# Patient Record
Sex: Female | Born: 1940 | Race: Black or African American | Hispanic: No | Marital: Married | Smoking: Former smoker
Health system: Southern US, Community
[De-identification: ages and names within clinical notes are randomized; demographics above are authoritative.]

## PROBLEM LIST (undated history)

## (undated) DIAGNOSIS — G309 Alzheimer's disease, unspecified: Secondary | ICD-10-CM

## (undated) DIAGNOSIS — I10 Essential (primary) hypertension: Secondary | ICD-10-CM

## (undated) DIAGNOSIS — E119 Type 2 diabetes mellitus without complications: Secondary | ICD-10-CM

## (undated) DIAGNOSIS — G43909 Migraine, unspecified, not intractable, without status migrainosus: Secondary | ICD-10-CM

## (undated) DIAGNOSIS — M797 Fibromyalgia: Secondary | ICD-10-CM

## (undated) DIAGNOSIS — F0390 Unspecified dementia without behavioral disturbance: Secondary | ICD-10-CM

## (undated) DIAGNOSIS — C801 Malignant (primary) neoplasm, unspecified: Secondary | ICD-10-CM

## (undated) DIAGNOSIS — K219 Gastro-esophageal reflux disease without esophagitis: Secondary | ICD-10-CM

## (undated) DIAGNOSIS — N189 Chronic kidney disease, unspecified: Secondary | ICD-10-CM

## (undated) DIAGNOSIS — J189 Pneumonia, unspecified organism: Secondary | ICD-10-CM

## (undated) DIAGNOSIS — I639 Cerebral infarction, unspecified: Secondary | ICD-10-CM

## (undated) DIAGNOSIS — F0280 Dementia in other diseases classified elsewhere without behavioral disturbance: Secondary | ICD-10-CM

## (undated) HISTORY — PX: SPINAL FUSION: SHX223

## (undated) HISTORY — PX: OTHER SURGICAL HISTORY: SHX169

## (undated) HISTORY — PX: BREAST SURGERY: SHX581

## (undated) HISTORY — PX: APPENDECTOMY: SHX54

## (undated) HISTORY — PX: ABDOMINAL HYSTERECTOMY: SHX81

## (undated) HISTORY — PX: TUMOR REMOVAL: SHX12

## (undated) HISTORY — PX: REPLACEMENT TOTAL KNEE: SUR1224

## (undated) HISTORY — PX: CHOLECYSTECTOMY: SHX55

---

## 2014-09-07 ENCOUNTER — Encounter (HOSPITAL_COMMUNITY): Payer: Self-pay | Admitting: Emergency Medicine

## 2014-09-07 ENCOUNTER — Emergency Department (HOSPITAL_COMMUNITY): Payer: Medicare HMO

## 2014-09-07 ENCOUNTER — Emergency Department (HOSPITAL_COMMUNITY)
Admission: EM | Admit: 2014-09-07 | Discharge: 2014-09-07 | Disposition: A | Discharge status: Home / Self Care | Payer: Medicare HMO | Attending: Emergency Medicine | Admitting: Emergency Medicine

## 2014-09-07 DIAGNOSIS — W010XXA Fall on same level from slipping, tripping and stumbling without subsequent striking against object, initial encounter: Secondary | ICD-10-CM | POA: Diagnosis not present

## 2014-09-07 DIAGNOSIS — G309 Alzheimer's disease, unspecified: Secondary | ICD-10-CM | POA: Insufficient documentation

## 2014-09-07 DIAGNOSIS — Z79899 Other long term (current) drug therapy: Secondary | ICD-10-CM | POA: Insufficient documentation

## 2014-09-07 DIAGNOSIS — I10 Essential (primary) hypertension: Secondary | ICD-10-CM | POA: Diagnosis not present

## 2014-09-07 DIAGNOSIS — S0083XA Contusion of other part of head, initial encounter: Secondary | ICD-10-CM | POA: Insufficient documentation

## 2014-09-07 DIAGNOSIS — Y92009 Unspecified place in unspecified non-institutional (private) residence as the place of occurrence of the external cause: Secondary | ICD-10-CM | POA: Insufficient documentation

## 2014-09-07 DIAGNOSIS — Z87891 Personal history of nicotine dependence: Secondary | ICD-10-CM | POA: Diagnosis not present

## 2014-09-07 DIAGNOSIS — S42351A Displaced comminuted fracture of shaft of humerus, right arm, initial encounter for closed fracture: Secondary | ICD-10-CM | POA: Diagnosis not present

## 2014-09-07 DIAGNOSIS — Y998 Other external cause status: Secondary | ICD-10-CM | POA: Insufficient documentation

## 2014-09-07 DIAGNOSIS — S42301A Unspecified fracture of shaft of humerus, right arm, initial encounter for closed fracture: Secondary | ICD-10-CM

## 2014-09-07 DIAGNOSIS — Y9301 Activity, walking, marching and hiking: Secondary | ICD-10-CM | POA: Insufficient documentation

## 2014-09-07 DIAGNOSIS — G43909 Migraine, unspecified, not intractable, without status migrainosus: Secondary | ICD-10-CM | POA: Diagnosis not present

## 2014-09-07 DIAGNOSIS — W19XXXA Unspecified fall, initial encounter: Secondary | ICD-10-CM

## 2014-09-07 DIAGNOSIS — S4991XA Unspecified injury of right shoulder and upper arm, initial encounter: Secondary | ICD-10-CM | POA: Diagnosis present

## 2014-09-07 HISTORY — DX: Essential (primary) hypertension: I10

## 2014-09-07 HISTORY — DX: Alzheimer's disease, unspecified: G30.9

## 2014-09-07 HISTORY — DX: Dementia in other diseases classified elsewhere, unspecified severity, without behavioral disturbance, psychotic disturbance, mood disturbance, and anxiety: F02.80

## 2014-09-07 HISTORY — DX: Migraine, unspecified, not intractable, without status migrainosus: G43.909

## 2014-09-07 LAB — CBG MONITORING, ED: Glucose-Capillary: 103 mg/dL — ABNORMAL HIGH (ref 65–99)

## 2014-09-07 MED ORDER — HYDROCODONE-ACETAMINOPHEN 5-325 MG PO TABS
2.0000 | ORAL_TABLET | Freq: Once | ORAL | Status: DC
  Filled 2014-09-07: qty 2

## 2014-09-07 MED ORDER — HYDROCODONE-ACETAMINOPHEN 5-325 MG PO TABS: 1.0000 | ORAL_TABLET | Freq: Four times a day (QID) | ORAL | Status: DC | PRN

## 2014-09-07 MED ORDER — MORPHINE SULFATE 4 MG/ML IJ SOLN
4.0000 mg | Freq: Once | INTRAMUSCULAR | Status: AC
  Administered 2014-09-07: 4 mg via INTRAVENOUS
  Filled 2014-09-07: qty 1

## 2014-09-07 NOTE — ED Provider Notes (Signed)
CSN: [id number]     Arrival date & time 09/07/14  1730 History   First MD Initiated Contact with Patient 09/07/14 1758     Chief Complaint  Patient presents with  . Fall  . Arm Injury     (Consider location/radiation/quality/duration/timing/severity/associated sxs/prior Treatment) Patient is a 74 y.o. female presenting with arm injury.  Arm Injury Location:  Arm Time since incident: shortly prior to arrival. Injury: yes   Mechanism of injury: fall   Fall:    Fall occurred: tripped on the carpet.   Point of impact:  Head (r arm) Arm location:  R upper arm Pain details:    Quality:  Sharp   Severity:  Severe   Onset quality:  Sudden   Timing:  Constant   Progression:  Unchanged Relieved by:  Nothing Worsened by:  Movement Associated symptoms: decreased range of motion (due to pain)   Associated symptoms: no fever, no neck pain and no numbness     Past Medical History  Diagnosis Date  . Hypertension   . Migraine   . Alzheimer disease    Past Surgical History  Procedure Laterality Date  . Replacement total knee Left   . Breast surgery    . Cholecystectomy    . Abdominal hysterectomy    . Tumor removal Right     right breast tumor removal   History reviewed. No pertinent family history. History  Substance Use Topics  . Smoking status: Former Research scientist (life sciences)  . Smokeless tobacco: Never Used  . Alcohol Use: No   OB History    No data available     Review of Systems  Constitutional: Negative for fever.  Musculoskeletal: Negative for neck pain.  All other systems reviewed and are negative.     Allergies  Codeine and Propoxyphene  Home Medications   Prior to Admission medications   Medication Sig Start Date End Date Taking? Authorizing Provider  acetaminophen (TYLENOL) 650 MG CR tablet Take 1,300 mg by mouth every 8 (eight) hours as needed for pain.   Yes Historical Provider, MD  diphenhydrAMINE (BENADRYL) 25 mg capsule Take 25 mg by mouth every 6 (six) hours  as needed for allergies.   Yes Historical Provider, MD  loperamide (IMODIUM) 2 MG capsule Take 2 mg by mouth as needed for diarrhea or loose stools.   Yes Historical Provider, MD  Multiple Vitamin (MULTIVITAMIN WITH MINERALS) TABS tablet Take 1 tablet by mouth daily.   Yes Historical Provider, MD  venlafaxine (EFFEXOR) 75 MG tablet Take 75 mg by mouth 2 (two) times daily.   Yes Historical Provider, MD   BP 157/81 mmHg  Temp(Src) 97.6 F (36.4 C) (Oral)  Resp 15  SpO2 97% Physical Exam  Constitutional: She is oriented to person, place, and time. She appears well-developed and well-nourished. No distress.  HENT:  Head: Normocephalic. Head is with contusion. Head is without raccoon's eyes and without Battle's sign.    Nose: Nose normal.  Eyes: Conjunctivae and EOM are normal. Pupils are equal, round, and reactive to light. No scleral icterus.  Neck: No spinous process tenderness and no muscular tenderness present.  Cardiovascular: Normal rate, regular rhythm, normal heart sounds and intact distal pulses.   No murmur heard. Pulmonary/Chest: Effort normal and breath sounds normal. She has no rales. She exhibits no tenderness.  Abdominal: Soft. There is no tenderness. There is no rebound and no guarding.  Musculoskeletal: Normal range of motion. She exhibits no edema.       Thoracic  back: She exhibits no tenderness and no bony tenderness.       Lumbar back: She exhibits no tenderness and no bony tenderness.       Right upper arm: She exhibits tenderness (NV intact distally), bony tenderness and deformity.  No evidence of trauma to extremities, except as noted.  2+ distal pulses.    Neurological: She is alert and oriented to person, place, and time.  Skin: Skin is warm and dry. No rash noted.  Psychiatric: She has a normal mood and affect.  Vitals reviewed.   ED Course  Procedures (including critical care time) Labs Review Labs Reviewed  CBG MONITORING, ED - Abnormal; Notable for the  following:    Glucose-Capillary 103 (*)    All other components within normal limits    Imaging Review Dg Shoulder Right  09/07/2014   CLINICAL DATA:  Tripped and fell at home tonight.  EXAM: RIGHT SHOULDER - 2+ VIEW  COMPARISON:  None.  FINDINGS: Limited views of the right shoulder are negative for acute fracture or dislocation.  IMPRESSION: Negative for acute fracture about the shoulder.   Electronically Signed   By: Andreas Newport M.D.   On: 09/07/2014 19:19   Ct Head Wo Contrast  09/07/2014   CLINICAL DATA:  Fall.  Syncope  EXAM: CT HEAD WITHOUT CONTRAST  CT CERVICAL SPINE WITHOUT CONTRAST  TECHNIQUE: Multidetector CT imaging of the head and cervical spine was performed following the standard protocol without intravenous contrast. Multiplanar CT image reconstructions of the cervical spine were also generated.  COMPARISON:  None.  FINDINGS: CT HEAD FINDINGS  Patchy low attenuation within the subcortical and periventricular white matter noted compatible with chronic microvascular disease. No evidence for acute cortical infarct, intracranial hemorrhage or mass. No abnormal extra-axial fluid collections noted. The paranasal stress set there is partial opacification of the sphenoid sinus. The paranasal sinuses are otherwise clear. The mastoid air cells are clear. The calvarium appears intact.  CT CERVICAL SPINE FINDINGS  Straightening of normal cervical lordosis. Multi level disc space narrowing and ventral endplate spurring is noted throughout the cervical spine. There is an anterolisthesis of C3 on C4 which is likely related to spondylosis. The facet joints are all well aligned. No fracture or subluxation identified.  IMPRESSION: 1. No acute intracranial abnormalities. 2. Small vessel ischemic change. 3. Advanced cervical spondylosis 4. No evidence for cervical spine fracture or dislocation.   Electronically Signed   By: Kerby Moors M.D.   On: 09/07/2014 20:29   Ct Cervical Spine Wo  Contrast  09/07/2014   CLINICAL DATA:  Fall.  Syncope  EXAM: CT HEAD WITHOUT CONTRAST  CT CERVICAL SPINE WITHOUT CONTRAST  TECHNIQUE: Multidetector CT imaging of the head and cervical spine was performed following the standard protocol without intravenous contrast. Multiplanar CT image reconstructions of the cervical spine were also generated.  COMPARISON:  None.  FINDINGS: CT HEAD FINDINGS  Patchy low attenuation within the subcortical and periventricular white matter noted compatible with chronic microvascular disease. No evidence for acute cortical infarct, intracranial hemorrhage or mass. No abnormal extra-axial fluid collections noted. The paranasal stress set there is partial opacification of the sphenoid sinus. The paranasal sinuses are otherwise clear. The mastoid air cells are clear. The calvarium appears intact.  CT CERVICAL SPINE FINDINGS  Straightening of normal cervical lordosis. Multi level disc space narrowing and ventral endplate spurring is noted throughout the cervical spine. There is an anterolisthesis of C3 on C4 which is likely related to spondylosis. The  facet joints are all well aligned. No fracture or subluxation identified.  IMPRESSION: 1. No acute intracranial abnormalities. 2. Small vessel ischemic change. 3. Advanced cervical spondylosis 4. No evidence for cervical spine fracture or dislocation.   Electronically Signed   By: Kerby Moors M.D.   On: 09/07/2014 20:29   Dg Humerus Right  09/07/2014   CLINICAL DATA:  Tripped and fell at home tonight.  EXAM: RIGHT HUMERUS - 2+ VIEW  COMPARISON:  None.  FINDINGS: There is a comminuted fracture of the distal humeral diaphysis with mild angulation and with mild medial displacement of a butterfly fragment. The fracture appears to spare the articulation at the elbow. There is no bone lesion to suggest a pathologic basis of the fracture.  IMPRESSION: Mildly comminuted distal diaphyseal fracture of the humerus.   Electronically Signed   By:  Andreas Newport M.D.   On: 09/07/2014 19:18     EKG Interpretation None      MDM   Final diagnoses:  Fall  Closed right humeral fracture, initial encounter  Forehead contusion, initial encounter    Mechanical fall while walking.  Forehead contusion and right arm deformity.    Imaging shows distal humerus fracture.  Head and C spine imaging negative.  Discussed case with Dr. Ronnie Derby (Ortho) who will see her in clinic tomorrow.    Serita Grit, MD 09/07/14 [phone]

## 2014-09-07 NOTE — Progress Notes (Signed)
CSW attempted to meet with patient at bedside. However, patient has been transported to xray. Daughter and son in law was present.   Daughter confirms that the patient fell today. She states that the patient fell due due to tripping on the carpet in the hallway. Also, she states that the patient does not fall often. Daughter says that the patient completes her ADL's independently.   Daughter informed CSW that the patient lives in [place]. She states that the pt is in [place] visiting and plans to return back to [facility] Monday.  Daughter states that she does not have any questions at this time.  Willette Brace [phone] ED CSW 09/07/2014 7:09 PM

## 2014-09-07 NOTE — ED Notes (Signed)
Bed: FX90 Expected date:  Expected time:  Means of arrival:  Comments: EMS/dementia pt/fall/shoulder deformity

## 2014-09-07 NOTE — ED Notes (Signed)
Ortho at bedside.

## 2014-09-07 NOTE — ED Notes (Signed)
[facility] presents with a 74 yo female from daughter's house with a fall.  Patient was ambulating around daughter's house and fell unwitnessed.  Was found by daughter's husband in corner with right arm raised above head.  [facility] states obvious deformity of right arm with possible dislocation.  Abrasion to forehead approximately 2 cm in length and 1 cm in width.  No drainage/bleeding from wound.  Patient states she is in a lot of pain.

## 2014-09-07 NOTE — Discharge Instructions (Signed)
Fall Prevention and Home Safety Falls cause injuries and can affect all age groups. It is possible to use preventive measures to significantly decrease the likelihood of falls. There are many simple measures which can make your home safer and prevent falls. OUTDOORS  Repair cracks and edges of walkways and driveways.  Remove high doorway thresholds.  Trim shrubbery on the main path into your home.  Have good outside lighting.  Clear walkways of tools, rocks, debris, and clutter.  Check that handrails are not broken and are securely fastened. Both sides of steps should have handrails.  Have leaves, snow, and ice cleared regularly.  Use sand or salt on walkways during winter months.  In the garage, clean up grease or oil spills. BATHROOM  Install night lights.  Install grab bars by the toilet and in the tub and shower.  Use non-skid mats or decals in the tub or shower.  Place a plastic non-slip stool in the shower to sit on, if needed.  Keep floors dry and clean up all water on the floor immediately.  Remove soap buildup in the tub or shower on a regular basis.  Secure bath mats with non-slip, double-sided rug tape.  Remove throw rugs and tripping hazards from the floors. BEDROOMS  Install night lights.  Make sure a bedside light is easy to reach.  Do not use oversized bedding.  Keep a telephone by your bedside.  Have a firm chair with side arms to use for getting dressed.  Remove throw rugs and tripping hazards from the floor. KITCHEN  Keep handles on pots and pans turned toward the center of the stove. Use back burners when possible.  Clean up spills quickly and allow time for drying.  Avoid walking on wet floors.  Avoid hot utensils and knives.  Position shelves so they are not too high or low.  Place commonly used objects within easy reach.  If necessary, use a sturdy step stool with a grab bar when reaching.  Keep electrical cables out of the  way.  Do not use floor polish or wax that makes floors slippery. If you must use wax, use non-skid floor wax.  Remove throw rugs and tripping hazards from the floor. STAIRWAYS  Never leave objects on stairs.  Place handrails on both sides of stairways and use them. Fix any loose handrails. Make sure handrails on both sides of the stairways are as long as the stairs.  Check carpeting to make sure it is firmly attached along stairs. Make repairs to worn or loose carpet promptly.  Avoid placing throw rugs at the top or bottom of stairways, or properly secure the rug with carpet tape to prevent slippage. Get rid of throw rugs, if possible.  Have an electrician put in a light switch at the top and bottom of the stairs. OTHER FALL PREVENTION TIPS  Wear low-heel or rubber-soled shoes that are supportive and fit well. Wear closed toe shoes.  When using a stepladder, make sure it is fully opened and both spreaders are firmly locked. Do not climb a closed stepladder.  Add color or contrast paint or tape to grab bars and handrails in your home. Place contrasting color strips on first and last steps.  Learn and use mobility aids as needed. Install an electrical emergency response system.  Turn on lights to avoid dark areas. Replace light bulbs that burn out immediately. Get light switches that glow.  Arrange furniture to create clear pathways. Keep furniture in the same place.  Firmly attach carpet with non-skid or double-sided tape.  Eliminate uneven floor surfaces.  Select a carpet pattern that does not visually hide the edge of steps.  Be aware of all pets. OTHER HOME SAFETY TIPS  Set the water temperature for 120 F (48.8 C).  Keep emergency numbers on or near the telephone.  Keep smoke detectors on every level of the home and near sleeping areas. Document Released: 01/18/2002 Document Revised: 07/30/2011 Document Reviewed: 04/19/2011 [facility] Patient Information 2015  [place], [place]. This information is not intended to replace advice given to you by your health care provider. Make sure you discuss any questions you have with your health care provider.   Humerus Fracture, Treated with Immobilization The humerus is the large bone in your upper arm. You have a broken (fractured) humerus. These fractures are easily diagnosed with X-rays. TREATMENT  Simple fractures which will heal without disability are treated with simple immobilization. Immobilization means you will wear a cast, splint, or sling. You have a fracture which will do well with immobilization. The fracture will heal well simply by being held in a good position until it is stable enough to begin range of motion exercises. Do not take part in activities which would further injure your arm.  HOME CARE INSTRUCTIONS   Put ice on the injured area.  Put ice in a plastic bag.  Place a towel between your skin and the bag.  Leave the ice on for 15-20 minutes, 03-04 times a day.  If you have a cast:  Do not scratch the skin under the cast using sharp or pointed objects.  Check the skin around the cast every day. You may put lotion on any red or sore areas.  Keep your cast dry and clean.  If you have a splint:  Wear the splint as directed.  Keep your splint dry and clean.  You may loosen the elastic around the splint if your fingers become numb, tingle, or turn cold or blue.  If you have a sling:  Wear the sling as directed.  Do not put pressure on any part of your cast or splint until it is fully hardened.  Your cast or splint can be protected during bathing with a plastic bag. Do not lower the cast or splint into water.  Only take over-the-counter or prescription medicines for pain, discomfort, or fever as directed by your caregiver.  Do range of motion exercises as instructed by your caregiver.  Follow up as directed by your caregiver. This is very important in order to avoid permanent  injury or disability and chronic pain. SEEK IMMEDIATE MEDICAL CARE IF:   Your skin or nails in the injured arm turn blue or gray.  Your arm feels cold or numb.  You develop severe pain in the injured arm.  You are having problems with the medicines you were given. MAKE SURE YOU:   Understand these instructions.  Will watch your condition.  Will get help right away if you are not doing well or get worse. Document Released: 05/06/2000 Document Revised: 04/22/2011 Document Reviewed: 03/14/2010 [facility] Patient Information 2015 [place], [place]. This information is not intended to replace advice given to you by your health care provider. Make sure you discuss any questions you have with your health care provider.  Splint Care Splints protect and rest injuries. Splints can be made of plaster, fiberglass, or metal. They are used to treat broken bones, sprains, tendonitis, and other injuries. HOME CARE  Keep the injured area  raised (elevated) while sitting or lying down. Keep the injured body part just above the level of the heart. This will decrease puffiness (swelling) and pain.  If an elastic bandage was used to hold the splint, it can be loosened. Only loosen it to make room for puffiness and to ease pain.  Keep the splint clean and dry.  Do not scratch the skin under the splint with sharp or pointed objects.  Follow up with your doctor as told. GET HELP RIGHT AWAY IF:   There is more pain or pressure around the injury.  There is numbness, tingling, or pain in the toes or fingers past the injury.  The fingers or toes become cold or blue.  The splint becomes too soft or breaks before the injury is healed. MAKE SURE YOU:   Understand these instructions.  Will watch this condition.  Will get help right away if you are not doing well or get worse. Document Released: 11/07/2007 Document Revised: 04/22/2011 Document Reviewed: 11/07/2007 [facility] Patient Information 2015  [place], [place]. This information is not intended to replace advice given to you by your health care provider. Make sure you discuss any questions you have with your health care provider.

## 2014-09-08 ENCOUNTER — Other Ambulatory Visit: Payer: Self-pay | Admitting: Orthopedic Surgery

## 2014-09-08 DIAGNOSIS — S42309S Unspecified fracture of shaft of humerus, unspecified arm, sequela: Secondary | ICD-10-CM

## 2014-09-09 ENCOUNTER — Other Ambulatory Visit: Payer: Medicare HMO

## 2014-09-09 ENCOUNTER — Other Ambulatory Visit: Payer: Self-pay | Admitting: Orthopedic Surgery

## 2014-09-09 ENCOUNTER — Ambulatory Visit
Admission: RE | Admit: 2014-09-09 | Discharge: 2014-09-09 | Disposition: A | Discharge status: Home / Self Care | Payer: Medicare HMO | Source: Ambulatory Visit | Attending: Orthopedic Surgery | Admitting: Orthopedic Surgery

## 2014-09-09 DIAGNOSIS — S42309S Unspecified fracture of shaft of humerus, unspecified arm, sequela: Secondary | ICD-10-CM

## 2014-09-15 NOTE — H&P (Signed)
PREOPERATIVE H&P  Chief Complaint: RIGHT HUMERUS FRACTURE, ULNAR NERVE INJURY  HPI: Bailey Taylor is a 74 y.o. female who presents for preoperative history and physical with a diagnosis of RIGHT HUMERUS FRACTURE, ULNAR NERVE INJURY. Symptoms are rated as moderate to severe, and have been worsening.  This is significantly impairing activities of daily living.  She has elected for surgical management.   Past Medical History  Diagnosis Date  . Hypertension   . Migraine   . Alzheimer disease    Past Surgical History  Procedure Laterality Date  . Replacement total knee Left   . Breast surgery    . Cholecystectomy    . Abdominal hysterectomy    . Tumor removal Right     right breast tumor removal   History   Social History  . Marital Status: Married    Spouse Name: N/A  . Number of Children: N/A  . Years of Education: N/A   Social History Main Topics  . Smoking status: Former Research scientist (life sciences)  . Smokeless tobacco: Never Used  . Alcohol Use: No  . Drug Use: No  . Sexual Activity: Not on file   Other Topics Concern  . Not on file   Social History Narrative  . No narrative on file   No family history on file. Allergies  Allergen Reactions  . Codeine Itching and Rash  . Propoxyphene Itching   Prior to Admission medications   Medication Sig Start Date End Date Taking? Authorizing Provider  acetaminophen (TYLENOL) 650 MG CR tablet Take 1,300 mg by mouth every 8 (eight) hours as needed for pain.    Historical Provider, MD  butalbital-acetaminophen-caffeine (FIORICET, ESGIC) 50-325-40 MG per tablet Take 1 tablet by mouth 3 (three) times daily as needed for headache.    Historical Provider, MD  diphenhydrAMINE (BENADRYL) 25 mg capsule Take 25 mg by mouth every 6 (six) hours as needed for allergies.    Historical Provider, MD  HYDROcodone-acetaminophen (NORCO/VICODIN) 5-325 MG per tablet Take 1-2 tablets by mouth every 6 (six) hours as needed. 09/07/14   Serita Grit, MD  loperamide  (IMODIUM) 2 MG capsule Take 2 mg by mouth as needed for diarrhea or loose stools.    Historical Provider, MD  Multiple Vitamin (MULTIVITAMIN WITH MINERALS) TABS tablet Take 1 tablet by mouth daily.    Historical Provider, MD  venlafaxine (EFFEXOR) 75 MG tablet Take 75 mg by mouth 2 (two) times daily.    Historical Provider, MD     Positive ROS: All other systems have been reviewed and were otherwise negative with the exception of those mentioned in the HPI and as above.  Physical Exam: General: Alert, no acute distress Cardiovascular: No pedal edema Respiratory: No cyanosis, no use of accessory musculature GI: No organomegaly, abdomen is soft and non-tender Skin: No lesions in the area of chief complaint Neurologic: Sensation intact distally Psychiatric: Patient is competent for consent with normal mood and affect Lymphatic: No axillary or cervical lymphadenopathy  MUSCULOSKELETAL: On exam she has moderate swelling and ecchymosis of the right arm.  Tender to palpation over the fracture site.  Sensation is intact with 2+ distal pulses  Assessment: RIGHT HUMERUS FRACTURE, ULNAR NERVE INJURY  Plan: Plan for Procedure(s): OPEN REDUCTION INTERNAL FIXATION (ORIF) DISTAL HUMERUS FRACTURE ULNAR NERVE DECOMPRESSION/TRANSPOSITION  The risks benefits and alternatives were discussed with the patient including but not limited to the risks of nonoperative treatment, versus surgical intervention including infection, bleeding, nerve injury,  blood clots, cardiopulmonary complications, morbidity, mortality, among  others, and they were willing to proceed.   Gae Dry, PA-C  09/15/2014 11:27 AM

## 2014-09-15 NOTE — Pre-Procedure Instructions (Signed)
Malayshia All  09/15/2014      [id number] - [place], [place] - [place] AT [place] [address] [place] [address] Phone: [phone] Fax: [phone]    Your procedure is scheduled on Tues, Aug 9 @ 7:30 AM  Report to [facility] Admitting at 5:30 AM.  Call this number if you have problems the morning of surgery:  [phone]   Remember:  Do not eat food or drink liquids after midnight.  Take these medicines the MORNING OF SURGERY with A SIP OF WATER Pain Pill(if needed) and Effexor(Venlafaxine)               No Goody's,BC's,Aleve,Aspirin,Ibuprofen,Fish Oil,or any Herbal Medications.    Do not wear jewelry, make-up or nail polish.  Do not wear lotions, powders, or perfumes.    Do not shave 48 hours prior to surgery.    Do not bring valuables to the hospital.  [facility] is not responsible for any belongings or valuables.  Contacts, dentures or bridgework may not be worn into surgery.  Leave your suitcase in the car.  After surgery it may be brought to your room.  For patients admitted to the hospital, discharge time will be determined by your treatment team.  Patients discharged the day of surgery will not be allowed to drive home.   [place] - Preparing for Surgery  Before surgery, you can play an important role.  Because skin is not sterile, your skin needs to be as free of germs as possible.  You can reduce the number of germs on you skin by washing with CHG (chlorahexidine gluconate) soap before surgery.  CHG is an antiseptic cleaner which kills germs and bonds with the skin to continue killing germs even after washing.  Please DO NOT use if you have an allergy to CHG or antibacterial soaps.  If your skin becomes reddened/irritated stop using the CHG and inform your nurse when you arrive at Short Stay.  Do not shave (including legs and underarms) for at least 48 hours prior to the first CHG shower.  You may  shave your face.  Please follow these instructions carefully:   1.  Shower with CHG Soap the night before surgery and the                                morning of Surgery.  2.  If you choose to wash your hair, wash your hair first as usual with your       normal shampoo.  3.  After you shampoo, rinse your hair and body thoroughly to remove the                      Shampoo.  4.  Use CHG as you would any other liquid soap.  You can apply chg directly       to the skin and wash gently with scrungie or a clean washcloth.  5.  Apply the CHG Soap to your body ONLY FROM THE NECK DOWN.        Do not use on open wounds or open sores.  Avoid contact with your eyes,       ears, mouth and genitals (private parts).  Wash genitals (private parts)       with your normal soap.  6.  Wash thoroughly, paying special attention  to the area where your surgery        will be performed.  7.  Thoroughly rinse your body with warm water from the neck down.  8.  DO NOT shower/wash with your normal soap after using and rinsing off       the CHG Soap.  9.  Pat yourself dry with a clean towel.            10.  Wear clean pajamas.            11.  Place clean sheets on your bed the night of your first shower and do not        sleep with pets.  Day of Surgery  Do not apply any lotions/deoderants the morning of surgery.  Please wear clean clothes to the hospital/surgery center.   Special instructions:    Please read over the following fact sheets that you were given. Pain Booklet, Coughing and Deep Breathing and Surgical Site Infection Prevention

## 2014-09-16 ENCOUNTER — Encounter (HOSPITAL_COMMUNITY): Payer: Self-pay

## 2014-09-16 ENCOUNTER — Encounter (HOSPITAL_COMMUNITY)
Admission: RE | Admit: 2014-09-16 | Discharge: 2014-09-16 | Disposition: A | Discharge status: Home / Self Care | Payer: Medicare HMO | Source: Ambulatory Visit | Attending: Orthopedic Surgery | Admitting: Orthopedic Surgery

## 2014-09-16 DIAGNOSIS — S42401A Unspecified fracture of lower end of right humerus, initial encounter for closed fracture: Secondary | ICD-10-CM | POA: Insufficient documentation

## 2014-09-16 DIAGNOSIS — W19XXXA Unspecified fall, initial encounter: Secondary | ICD-10-CM | POA: Insufficient documentation

## 2014-09-16 DIAGNOSIS — K219 Gastro-esophageal reflux disease without esophagitis: Secondary | ICD-10-CM | POA: Diagnosis not present

## 2014-09-16 DIAGNOSIS — Z79899 Other long term (current) drug therapy: Secondary | ICD-10-CM | POA: Diagnosis not present

## 2014-09-16 DIAGNOSIS — G309 Alzheimer's disease, unspecified: Secondary | ICD-10-CM | POA: Insufficient documentation

## 2014-09-16 DIAGNOSIS — Z01812 Encounter for preprocedural laboratory examination: Secondary | ICD-10-CM | POA: Insufficient documentation

## 2014-09-16 DIAGNOSIS — Z01818 Encounter for other preprocedural examination: Secondary | ICD-10-CM | POA: Diagnosis present

## 2014-09-16 DIAGNOSIS — Z8673 Personal history of transient ischemic attack (TIA), and cerebral infarction without residual deficits: Secondary | ICD-10-CM | POA: Diagnosis not present

## 2014-09-16 DIAGNOSIS — Z853 Personal history of malignant neoplasm of breast: Secondary | ICD-10-CM | POA: Insufficient documentation

## 2014-09-16 DIAGNOSIS — R9431 Abnormal electrocardiogram [ECG] [EKG]: Secondary | ICD-10-CM | POA: Insufficient documentation

## 2014-09-16 DIAGNOSIS — R7309 Other abnormal glucose: Secondary | ICD-10-CM | POA: Insufficient documentation

## 2014-09-16 DIAGNOSIS — Z87891 Personal history of nicotine dependence: Secondary | ICD-10-CM | POA: Insufficient documentation

## 2014-09-16 DIAGNOSIS — I10 Essential (primary) hypertension: Secondary | ICD-10-CM | POA: Diagnosis not present

## 2014-09-16 DIAGNOSIS — F0280 Dementia in other diseases classified elsewhere without behavioral disturbance: Secondary | ICD-10-CM | POA: Insufficient documentation

## 2014-09-16 HISTORY — DX: Chronic kidney disease, unspecified: N18.9

## 2014-09-16 HISTORY — DX: Unspecified dementia, unspecified severity, without behavioral disturbance, psychotic disturbance, mood disturbance, and anxiety: F03.90

## 2014-09-16 HISTORY — DX: Fibromyalgia: M79.7

## 2014-09-16 HISTORY — DX: Type 2 diabetes mellitus without complications: E11.9

## 2014-09-16 HISTORY — DX: Gastro-esophageal reflux disease without esophagitis: K21.9

## 2014-09-16 HISTORY — DX: Cerebral infarction, unspecified: I63.9

## 2014-09-16 HISTORY — DX: Pneumonia, unspecified organism: J18.9

## 2014-09-16 HISTORY — DX: Malignant (primary) neoplasm, unspecified: C80.1

## 2014-09-16 LAB — BASIC METABOLIC PANEL
Anion gap: 10 (ref 5–15)
BUN: 13 mg/dL (ref 6–20)
CHLORIDE: 105 mmol/L (ref 101–111)
CO2: 26 mmol/L (ref 22–32)
CREATININE: 0.68 mg/dL (ref 0.44–1.00)
Calcium: 9.2 mg/dL (ref 8.9–10.3)
GFR calc non Af Amer: >60 mL/min (ref >60)
Glucose, Bld: 110 mg/dL — ABNORMAL HIGH (ref 65–99)
POTASSIUM: 4.3 mmol/L (ref 3.5–5.1)
Sodium: 141 mmol/L (ref 135–145)

## 2014-09-16 LAB — CBC
HCT: 29.5 % — ABNORMAL LOW (ref 36.0–46.0)
Hemoglobin: 9.6 g/dL — ABNORMAL LOW (ref 12.0–15.0)
MCH: 29.4 pg (ref 26.0–34.0)
MCHC: 32.5 g/dL (ref 30.0–36.0)
MCV: 90.2 fL (ref 78.0–100.0)
Platelets: 437 10*3/uL — ABNORMAL HIGH (ref 150–400)
RBC: 3.27 MIL/uL — ABNORMAL LOW (ref 3.87–5.11)
RDW: 16.3 % — ABNORMAL HIGH (ref 11.5–15.5)
WBC: 9.4 10*3/uL (ref 4.0–10.5)

## 2014-09-16 LAB — SURGICAL PCR SCREEN
MRSA, PCR: NEGATIVE
STAPHYLOCOCCUS AUREUS: NEGATIVE

## 2014-09-16 LAB — GLUCOSE, CAPILLARY: Glucose-Capillary: 157 mg/dL — ABNORMAL HIGH (ref 65–99)

## 2014-09-16 NOTE — Progress Notes (Addendum)
Patient resides now, in [place], and came to visit daughter.  Tripped over rug and broke arm. Her daughter states that mother moved to [place] in 2011 after living in [place] since 2000.  Ms. Tollie Pizza states her mother has the begininngs of dementia.   Patient did state she had a treadmill stress test back in 2006 for "allergy" testing, but also said it was normal.  Does not see or has seen a cardiologist since.  Denies any cardiac problems.  Ms Tollie Pizza states her mother has had 3 mini strokes, but it was 10-15 yrs ago -  No deficits. PCP is Dr. Wyline Beady from [facility] in [place]  I have sent a request for LOV, EKG and if possible, results of this stress test.  Fax is [phone]  Spoke with Butch Penny @ Dr. Debroah Loop office (12:39pm), asking her to have Dr. Percell Miller look at patient's lab (hgb 9.6)

## 2014-09-16 NOTE — Progress Notes (Signed)
Anesthesia Chart Review:  Pt is 74 year old female scheduled for ORIF distal humerus fracture on 09/20/2014 with Dr. Alain Marion.   Pt lives in [place], is here visiting daughter and fell, injuring arm. PCP is Dr. Wyline Beady at the [facility].   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 Former smoker. BMI 36.   Medications include: fioricet, lisinopril-hctz, effexor.   Preoperative labs reviewed.  H/H 9.6/29.5. PAT RN notified Butch Penny in Dr. Debroah Loop office. Ordered T&S for DOS.   EKG 09/16/2014: NSR. LVH.   At PAT, pt's daughter reported pt had a treadmill stress test in 2006 as part of allergy testing; reportedly results were normal. Attempted to obtain records from PCP's office but there was none available.   If no changes, I anticipate pt can proceed with surgery as scheduled.   Willeen Cass, FNP-BC [facility] Short Stay Surgical Center/Anesthesiology Phone: [phone] 09/16/2014 4:59 PM

## 2014-09-17 LAB — HEMOGLOBIN A1C
Hgb A1c MFr Bld: 5.7 % — ABNORMAL HIGH (ref 4.8–5.6)
MEAN PLASMA GLUCOSE: 117 mg/dL

## 2014-09-20 ENCOUNTER — Ambulatory Visit (HOSPITAL_COMMUNITY): Payer: Medicare HMO | Admitting: Emergency Medicine

## 2014-09-20 ENCOUNTER — Observation Stay (HOSPITAL_COMMUNITY)
Admission: RE | Admit: 2014-09-20 | Discharge: 2014-09-21 | Disposition: A | Discharge status: Home / Self Care | Payer: Medicare HMO | Source: Ambulatory Visit | Attending: Orthopedic Surgery | Admitting: Orthopedic Surgery

## 2014-09-20 ENCOUNTER — Encounter (HOSPITAL_COMMUNITY): Payer: Self-pay | Admitting: *Deleted

## 2014-09-20 ENCOUNTER — Ambulatory Visit (HOSPITAL_COMMUNITY): Payer: Medicare HMO | Admitting: Anesthesiology

## 2014-09-20 ENCOUNTER — Ambulatory Visit (HOSPITAL_COMMUNITY): Payer: Medicare HMO

## 2014-09-20 ENCOUNTER — Encounter (HOSPITAL_COMMUNITY)
Admission: RE | Disposition: A | Discharge status: Home / Self Care | Payer: Self-pay | Source: Ambulatory Visit | Attending: Orthopedic Surgery

## 2014-09-20 DIAGNOSIS — S42409A Unspecified fracture of lower end of unspecified humerus, initial encounter for closed fracture: Secondary | ICD-10-CM

## 2014-09-20 DIAGNOSIS — S5401XA Injury of ulnar nerve at forearm level, right arm, initial encounter: Secondary | ICD-10-CM | POA: Diagnosis not present

## 2014-09-20 DIAGNOSIS — Z87891 Personal history of nicotine dependence: Secondary | ICD-10-CM | POA: Diagnosis not present

## 2014-09-20 DIAGNOSIS — G309 Alzheimer's disease, unspecified: Secondary | ICD-10-CM | POA: Insufficient documentation

## 2014-09-20 DIAGNOSIS — S42461A Displaced fracture of medial condyle of right humerus, initial encounter for closed fracture: Secondary | ICD-10-CM | POA: Diagnosis present

## 2014-09-20 DIAGNOSIS — S42309A Unspecified fracture of shaft of humerus, unspecified arm, initial encounter for closed fracture: Secondary | ICD-10-CM | POA: Diagnosis present

## 2014-09-20 DIAGNOSIS — X58XXXA Exposure to other specified factors, initial encounter: Secondary | ICD-10-CM | POA: Diagnosis not present

## 2014-09-20 DIAGNOSIS — I10 Essential (primary) hypertension: Secondary | ICD-10-CM | POA: Insufficient documentation

## 2014-09-20 HISTORY — PX: ULNAR NERVE TRANSPOSITION: SHX2595

## 2014-09-20 HISTORY — PX: ORIF HUMERUS FRACTURE: SHX2126

## 2014-09-20 LAB — GLUCOSE, CAPILLARY: GLUCOSE-CAPILLARY: 108 mg/dL — AB (ref 65–99)

## 2014-09-20 LAB — ABO/RH: ABO/RH(D): O POS

## 2014-09-20 LAB — TYPE AND SCREEN
ABO/RH(D): O POS
Antibody Screen: NEGATIVE

## 2014-09-20 SURGERY — OPEN REDUCTION INTERNAL FIXATION (ORIF) DISTAL HUMERUS FRACTURE
Anesthesia: General | Site: Arm Lower | Laterality: Right

## 2014-09-20 MED ORDER — ASPIRIN 325 MG PO TABS
325.0000 mg | ORAL_TABLET | Freq: Every day | ORAL | Status: DC
  Administered 2014-09-20 – 2014-09-21 (×2): 325 mg via ORAL
  Filled 2014-09-20: qty 1

## 2014-09-20 MED ORDER — OXYCODONE HCL 5 MG PO TABS
5.0000 mg | ORAL_TABLET | Freq: Once | ORAL | Status: AC | PRN
  Administered 2014-09-20: 5 mg via ORAL

## 2014-09-20 MED ORDER — ROCURONIUM BROMIDE 100 MG/10ML IV SOLN
INTRAVENOUS | Status: DC | PRN
  Administered 2014-09-20: 40 mg via INTRAVENOUS

## 2014-09-20 MED ORDER — FENTANYL CITRATE (PF) 100 MCG/2ML IJ SOLN
INTRAMUSCULAR | Status: AC
  Filled 2014-09-20: qty 2

## 2014-09-20 MED ORDER — ONDANSETRON HCL 4 MG/2ML IJ SOLN: 4.0000 mg | Freq: Four times a day (QID) | INTRAMUSCULAR | Status: DC | PRN

## 2014-09-20 MED ORDER — OXYCODONE HCL 5 MG PO TABS
5.0000 mg | ORAL_TABLET | ORAL | Status: DC | PRN
  Administered 2014-09-20 – 2014-09-21 (×4): 10 mg via ORAL
  Filled 2014-09-20 (×4): qty 2

## 2014-09-20 MED ORDER — POLYETHYLENE GLYCOL 3350 17 G PO PACK: 17.0000 g | PACK | Freq: Every day | ORAL | Status: DC | PRN

## 2014-09-20 MED ORDER — ACETAMINOPHEN 325 MG PO TABS
650.0000 mg | ORAL_TABLET | Freq: Four times a day (QID) | ORAL | Status: DC | PRN
  Administered 2014-09-21: 650 mg via ORAL
  Filled 2014-09-20: qty 2

## 2014-09-20 MED ORDER — GLYCOPYRROLATE 0.2 MG/ML IJ SOLN
INTRAMUSCULAR | Status: AC
  Filled 2014-09-20: qty 2

## 2014-09-20 MED ORDER — ROCURONIUM BROMIDE 50 MG/5ML IV SOLN
INTRAVENOUS | Status: AC
  Filled 2014-09-20: qty 1

## 2014-09-20 MED ORDER — CEFAZOLIN SODIUM-DEXTROSE 2-3 GM-% IV SOLR
INTRAVENOUS | Status: AC
  Filled 2014-09-20: qty 50

## 2014-09-20 MED ORDER — FENTANYL CITRATE (PF) 100 MCG/2ML IJ SOLN
INTRAMUSCULAR | Status: DC | PRN
  Administered 2014-09-20: 50 ug via INTRAVENOUS
  Administered 2014-09-20: 100 ug via INTRAVENOUS
  Administered 2014-09-20 (×2): 50 ug via INTRAVENOUS

## 2014-09-20 MED ORDER — SODIUM CHLORIDE 0.9 % IJ SOLN
INTRAMUSCULAR | Status: AC
  Filled 2014-09-20: qty 3

## 2014-09-20 MED ORDER — PROPOFOL 10 MG/ML IV BOLUS
INTRAVENOUS | Status: DC | PRN
  Administered 2014-09-20: 150 mg via INTRAVENOUS

## 2014-09-20 MED ORDER — ONDANSETRON HCL 4 MG PO TABS: 4.0000 mg | ORAL_TABLET | Freq: Three times a day (TID) | ORAL | Status: AC | PRN

## 2014-09-20 MED ORDER — DOCUSATE SODIUM 100 MG PO CAPS: 100.0000 mg | ORAL_CAPSULE | Freq: Two times a day (BID) | ORAL | Status: AC

## 2014-09-20 MED ORDER — DIPHENHYDRAMINE HCL 12.5 MG/5ML PO ELIX: 12.5000 mg | ORAL_SOLUTION | ORAL | Status: DC | PRN

## 2014-09-20 MED ORDER — ONDANSETRON HCL 4 MG/2ML IJ SOLN
INTRAMUSCULAR | Status: DC | PRN
  Administered 2014-09-20: 4 mg via INTRAVENOUS

## 2014-09-20 MED ORDER — CEFAZOLIN SODIUM-DEXTROSE 2-3 GM-% IV SOLR
2.0000 g | INTRAVENOUS | Status: AC
  Administered 2014-09-20: 2 g via INTRAVENOUS

## 2014-09-20 MED ORDER — HYDROCODONE-ACETAMINOPHEN 5-325 MG PO TABS: 1.0000 | ORAL_TABLET | Freq: Four times a day (QID) | ORAL | Status: DC | PRN

## 2014-09-20 MED ORDER — MIDAZOLAM HCL 5 MG/5ML IJ SOLN
INTRAMUSCULAR | Status: DC | PRN
  Administered 2014-09-20: 2 mg via INTRAVENOUS

## 2014-09-20 MED ORDER — PHENYLEPHRINE HCL 10 MG/ML IJ SOLN
10.0000 mg | INTRAMUSCULAR | Status: DC | PRN
  Administered 2014-09-20: 25 ug/min via INTRAVENOUS

## 2014-09-20 MED ORDER — OXYCODONE HCL 5 MG/5ML PO SOLN: 5.0000 mg | Freq: Once | ORAL | Status: AC | PRN

## 2014-09-20 MED ORDER — ACETAMINOPHEN 325 MG PO TABS: 325.0000 mg | ORAL_TABLET | ORAL | Status: DC | PRN

## 2014-09-20 MED ORDER — LIDOCAINE HCL (CARDIAC) 20 MG/ML IV SOLN
INTRAVENOUS | Status: DC | PRN
  Administered 2014-09-20: 50 mg via INTRAVENOUS

## 2014-09-20 MED ORDER — FENTANYL CITRATE (PF) 100 MCG/2ML IJ SOLN
25.0000 ug | INTRAMUSCULAR | Status: DC | PRN
  Administered 2014-09-20: 25 ug via INTRAVENOUS

## 2014-09-20 MED ORDER — LISINOPRIL 20 MG PO TABS
20.0000 mg | ORAL_TABLET | Freq: Every day | ORAL | Status: DC
  Administered 2014-09-20 – 2014-09-21 (×2): 20 mg via ORAL
  Filled 2014-09-20 (×2): qty 1

## 2014-09-20 MED ORDER — LISINOPRIL-HYDROCHLOROTHIAZIDE 20-25 MG PO TABS: 1.0000 | ORAL_TABLET | Freq: Every day | ORAL | Status: DC

## 2014-09-20 MED ORDER — NEOSTIGMINE METHYLSULFATE 10 MG/10ML IV SOLN
INTRAVENOUS | Status: DC | PRN
  Administered 2014-09-20: 3 mg via INTRAVENOUS

## 2014-09-20 MED ORDER — FENTANYL CITRATE (PF) 250 MCG/5ML IJ SOLN
INTRAMUSCULAR | Status: AC
Start: 2014-09-20 — End: 2014-09-20
  Filled 2014-09-20: qty 5

## 2014-09-20 MED ORDER — ACETAMINOPHEN 650 MG RE SUPP: 650.0000 mg | Freq: Four times a day (QID) | RECTAL | Status: DC | PRN

## 2014-09-20 MED ORDER — ONDANSETRON HCL 4 MG PO TABS: 4.0000 mg | ORAL_TABLET | Freq: Four times a day (QID) | ORAL | Status: DC | PRN

## 2014-09-20 MED ORDER — LIDOCAINE HCL (CARDIAC) 20 MG/ML IV SOLN
INTRAVENOUS | Status: AC
  Filled 2014-09-20: qty 5

## 2014-09-20 MED ORDER — ONDANSETRON HCL 4 MG/2ML IJ SOLN
INTRAMUSCULAR | Status: AC
  Filled 2014-09-20: qty 2

## 2014-09-20 MED ORDER — METOCLOPRAMIDE HCL 5 MG PO TABS: 5.0000 mg | ORAL_TABLET | Freq: Three times a day (TID) | ORAL | Status: DC | PRN

## 2014-09-20 MED ORDER — OXYCODONE HCL 5 MG PO TABS
ORAL_TABLET | ORAL | Status: AC
  Filled 2014-09-20: qty 1

## 2014-09-20 MED ORDER — CHLORHEXIDINE GLUCONATE 4 % EX LIQD: 60.0000 mL | Freq: Once | CUTANEOUS | Status: DC

## 2014-09-20 MED ORDER — CEFAZOLIN SODIUM-DEXTROSE 2-3 GM-% IV SOLR
2.0000 g | Freq: Four times a day (QID) | INTRAVENOUS | Status: AC
  Administered 2014-09-20 – 2014-09-21 (×3): 2 g via INTRAVENOUS
  Filled 2014-09-20 (×3): qty 50

## 2014-09-20 MED ORDER — GLYCOPYRROLATE 0.2 MG/ML IJ SOLN
INTRAMUSCULAR | Status: DC | PRN
  Administered 2014-09-20: 0.4 mg via INTRAVENOUS

## 2014-09-20 MED ORDER — LACTATED RINGERS IV SOLN
INTRAVENOUS | Status: DC
  Administered 2014-09-20: 10:00:00 via INTRAVENOUS

## 2014-09-20 MED ORDER — POTASSIUM CHLORIDE IN NACL 20-0.45 MEQ/L-% IV SOLN: INTRAVENOUS | Status: DC

## 2014-09-20 MED ORDER — DOCUSATE SODIUM 100 MG PO CAPS
100.0000 mg | ORAL_CAPSULE | Freq: Two times a day (BID) | ORAL | Status: DC
  Administered 2014-09-20 – 2014-09-21 (×2): 100 mg via ORAL
  Filled 2014-09-20 (×2): qty 1

## 2014-09-20 MED ORDER — MIDAZOLAM HCL 2 MG/2ML IJ SOLN
INTRAMUSCULAR | Status: AC
  Filled 2014-09-20: qty 4

## 2014-09-20 MED ORDER — PHENYLEPHRINE HCL 10 MG/ML IJ SOLN
INTRAMUSCULAR | Status: DC | PRN
  Administered 2014-09-20: 120 ug via INTRAVENOUS
  Administered 2014-09-20: 80 ug via INTRAVENOUS
  Administered 2014-09-20: 120 ug via INTRAVENOUS
  Administered 2014-09-20: 80 ug via INTRAVENOUS

## 2014-09-20 MED ORDER — BUPIVACAINE-EPINEPHRINE (PF) 0.5% -1:200000 IJ SOLN
INTRAMUSCULAR | Status: DC | PRN
  Administered 2014-09-20: 20 mL via PERINEURAL

## 2014-09-20 MED ORDER — ACETAMINOPHEN 160 MG/5ML PO SOLN: 325.0000 mg | ORAL | Status: DC | PRN

## 2014-09-20 MED ORDER — VENLAFAXINE HCL 37.5 MG PO TABS
75.0000 mg | ORAL_TABLET | Freq: Two times a day (BID) | ORAL | Status: DC
Start: 2014-09-20 — End: 2014-09-21
  Administered 2014-09-20 – 2014-09-21 (×2): 75 mg via ORAL
  Filled 2014-09-20 (×2): qty 2

## 2014-09-20 MED ORDER — METOCLOPRAMIDE HCL 5 MG/ML IJ SOLN: 5.0000 mg | Freq: Three times a day (TID) | INTRAMUSCULAR | Status: DC | PRN

## 2014-09-20 MED ORDER — MIDAZOLAM HCL 2 MG/2ML IJ SOLN
INTRAMUSCULAR | Status: AC
  Filled 2014-09-20: qty 2

## 2014-09-20 MED ORDER — HYDROMORPHONE HCL 1 MG/ML IJ SOLN
1.0000 mg | INTRAMUSCULAR | Status: DC | PRN
  Filled 2014-09-20: qty 1

## 2014-09-20 MED ORDER — ASPIRIN 325 MG PO TABS: 325.0000 mg | ORAL_TABLET | Freq: Every day | ORAL | Status: AC

## 2014-09-20 MED ORDER — POTASSIUM CHLORIDE IN NACL 20-0.45 MEQ/L-% IV SOLN
INTRAVENOUS | Status: DC
  Administered 2014-09-20: 21:00:00 via INTRAVENOUS
  Filled 2014-09-20 (×3): qty 1000

## 2014-09-20 MED ORDER — BUTALBITAL-APAP-CAFFEINE 50-325-40 MG PO TABS
1.0000 | ORAL_TABLET | Freq: Three times a day (TID) | ORAL | Status: DC | PRN
  Administered 2014-09-20 – 2014-09-21 (×2): 1 via ORAL
  Filled 2014-09-20 (×2): qty 1

## 2014-09-20 MED ORDER — HYDROCHLOROTHIAZIDE 25 MG PO TABS
25.0000 mg | ORAL_TABLET | Freq: Every day | ORAL | Status: DC
  Administered 2014-09-21: 25 mg via ORAL
  Filled 2014-09-20 (×2): qty 1

## 2014-09-20 SURGICAL SUPPLY — 93 items
BANDAGE ELASTIC 3 VELCRO ST LF (GAUZE/BANDAGES/DRESSINGS) ×3 IMPLANT
BANDAGE ELASTIC 4 VELCRO ST LF (GAUZE/BANDAGES/DRESSINGS) ×3 IMPLANT
BANDAGE ELASTIC 6 VELCRO ST LF (GAUZE/BANDAGES/DRESSINGS) ×3 IMPLANT
BENZOIN TINCTURE PRP APPL 2/3 (GAUZE/BANDAGES/DRESSINGS) IMPLANT
BIT DRILL 2.6 (BIT) ×3 IMPLANT
BIT DRILL 3.5MM (BIT) ×1
BIT DRILL 4.9 CANNULATED (BIT) ×1
BIT DRILL CANN QC 4.9 LRG (BIT) ×1 IMPLANT
BIT DRILL OVERDRILL 3.5X122 (BIT) ×2 IMPLANT
BLADE OSC/SAGITTAL MD 5.5X18 (BLADE) ×3 IMPLANT
BLADE SAW SGTL 81X20 HD (BLADE) ×3 IMPLANT
BLADE SURG 10 STRL SS (BLADE) IMPLANT
BLADE SURG ROTATE 9660 (MISCELLANEOUS) ×3 IMPLANT
BNDG COHESIVE 4X5 TAN STRL (GAUZE/BANDAGES/DRESSINGS) ×3 IMPLANT
BNDG ESMARK 4X9 LF (GAUZE/BANDAGES/DRESSINGS) IMPLANT
BNDG GAUZE ELAST 4 BULKY (GAUZE/BANDAGES/DRESSINGS) ×3 IMPLANT
CLOSURE WOUND 1/2 X4 (GAUZE/BANDAGES/DRESSINGS)
COVER BACK TABLE 60X90IN (DRAPES) ×3 IMPLANT
COVER SURGICAL LIGHT HANDLE (MISCELLANEOUS) ×3 IMPLANT
CUFF TOURNIQUET SINGLE 18IN (TOURNIQUET CUFF) ×3 IMPLANT
CUFF TOURNIQUET SINGLE 24IN (TOURNIQUET CUFF) IMPLANT
DRAPE C-ARM 42X72 X-RAY (DRAPES) ×3 IMPLANT
DRAPE IMP U-DRAPE 54X76 (DRAPES) ×6 IMPLANT
DRAPE INCISE 23X17 IOBAN STRL (DRAPES) ×2
DRAPE INCISE IOBAN 23X17 STRL (DRAPES) ×1 IMPLANT
DRAPE INCISE IOBAN 66X45 STRL (DRAPES) IMPLANT
DRAPE ORTHO SPLIT 77X108 STRL (DRAPES) ×4
DRAPE SURG ORHT 6 SPLT 77X108 (DRAPES) ×2 IMPLANT
DRAPE U-SHAPE 47X51 STRL (DRAPES) ×3 IMPLANT
DRILL BIT CANNULATED 4.9 (BIT) ×2
DRSG ADAPTIC 3X8 NADH LF (GAUZE/BANDAGES/DRESSINGS) ×3 IMPLANT
DRSG PAD ABDOMINAL 8X10 ST (GAUZE/BANDAGES/DRESSINGS) ×6 IMPLANT
DURAPREP 26ML APPLICATOR (WOUND CARE) ×3 IMPLANT
ELECT REM PT RETURN 9FT ADLT (ELECTROSURGICAL) ×3
ELECTRODE REM PT RTRN 9FT ADLT (ELECTROSURGICAL) ×1 IMPLANT
GAUZE SPONGE 4X4 12PLY STRL (GAUZE/BANDAGES/DRESSINGS) ×3 IMPLANT
GAUZE XEROFORM 5X9 LF (GAUZE/BANDAGES/DRESSINGS) ×3 IMPLANT
GLOVE BIO SURGEON STRL SZ7 (GLOVE) ×3 IMPLANT
GLOVE BIO SURGEON STRL SZ7.5 (GLOVE) ×3 IMPLANT
GLOVE BIOGEL PI IND STRL 7.0 (GLOVE) ×1 IMPLANT
GLOVE BIOGEL PI IND STRL 8 (GLOVE) ×1 IMPLANT
GLOVE BIOGEL PI INDICATOR 7.0 (GLOVE) ×2
GLOVE BIOGEL PI INDICATOR 8 (GLOVE) ×2
GLOVE SURG SS PI 7.5 STRL IVOR (GLOVE) ×3 IMPLANT
GOWN STRL REUS W/ TWL LRG LVL3 (GOWN DISPOSABLE) ×2 IMPLANT
GOWN STRL REUS W/ TWL XL LVL3 (GOWN DISPOSABLE) ×1 IMPLANT
GOWN STRL REUS W/TWL LRG LVL3 (GOWN DISPOSABLE) ×4
GOWN STRL REUS W/TWL XL LVL3 (GOWN DISPOSABLE) ×2
GUIDEWIRE THRD ASNIS 3.2X300 (WIRE) ×3 IMPLANT
KIT BASIN OR (CUSTOM PROCEDURE TRAY) ×3 IMPLANT
KIT ROOM TURNOVER OR (KITS) ×3 IMPLANT
LOOP VESSEL MINI RED (MISCELLANEOUS) ×3 IMPLANT
MANIFOLD NEPTUNE II (INSTRUMENTS) ×3 IMPLANT
NS IRRIG 1000ML POUR BTL (IV SOLUTION) ×3 IMPLANT
PACK ORTHO EXTREMITY (CUSTOM PROCEDURE TRAY) ×3 IMPLANT
PAD ARMBOARD 7.5X6 YLW CONV (MISCELLANEOUS) ×6 IMPLANT
PAD CAST 4YDX4 CTTN HI CHSV (CAST SUPPLIES) ×1 IMPLANT
PADDING CAST COTTON 4X4 STRL (CAST SUPPLIES) ×2
PADDING CAST COTTON 6X4 STRL (CAST SUPPLIES) ×3 IMPLANT
PLATE HUMERUS LAT DIST 6H RT (Plate) ×3 IMPLANT
PLATE MEDIAL POSTERIOR 6H RT (Plate) ×3 IMPLANT
SCREW 6.5X105 (Screw) ×3 IMPLANT
SCREW BONE 28MMX3.5MM (Screw) ×9 IMPLANT
SCREW BONE 3.5X24MM (Screw) ×12 IMPLANT
SCREW BONE 3.5X26MM (Screw) ×9 IMPLANT
SCREW BONE 34MMX3.5MM (Screw) ×3 IMPLANT
SCREW CANN 8.0X105 HIP (Screw) ×3 IMPLANT
SCREW LOCKING 14MM (Screw) ×3 IMPLANT
SCREW LOCKING 18MMX3.5MM (Screw) ×3 IMPLANT
SCREW LOCKING 24MMX3.5MM (Screw) ×6 IMPLANT
SCREW LOCKING 26MM (Screw) ×3 IMPLANT
SCREW LOCKING 30MMX3.5MM (Screw) ×3 IMPLANT
SCREW LOCKING 40MMX3.5MM (Screw) ×3 IMPLANT
SCREW NON LOCKING 22MM (Screw) ×3 IMPLANT
SCREW PT CANC 95MMX6.0MM (Screw) ×3 IMPLANT
SPONGE GAUZE 4X4 12PLY STER LF (GAUZE/BANDAGES/DRESSINGS) ×3 IMPLANT
SPONGE LAP 18X18 X RAY DECT (DISPOSABLE) IMPLANT
STAPLER VISISTAT 35W (STAPLE) ×3 IMPLANT
STOCKINETTE IMPERVIOUS LG (DRAPES) ×3 IMPLANT
STRIP CLOSURE SKIN 1/2X4 (GAUZE/BANDAGES/DRESSINGS) IMPLANT
SUCTION FRAZIER TIP 10 FR DISP (SUCTIONS) ×3 IMPLANT
SUT MNCRL AB 4-0 PS2 18 (SUTURE) ×3 IMPLANT
SUT MON AB 2-0 CT1 36 (SUTURE) ×3 IMPLANT
SUT VIC AB 0 CT1 27 (SUTURE) ×2
SUT VIC AB 0 CT1 27XBRD ANBCTR (SUTURE) ×1 IMPLANT
SYR CONTROL 10ML LL (SYRINGE) IMPLANT
TOWEL OR 17X24 6PK STRL BLUE (TOWEL DISPOSABLE) ×3 IMPLANT
TOWEL OR 17X26 10 PK STRL BLUE (TOWEL DISPOSABLE) ×3 IMPLANT
TUBE CONNECTING 12'X1/4 (SUCTIONS) ×1
TUBE CONNECTING 12X1/4 (SUCTIONS) ×2 IMPLANT
UNDERPAD 30X30 INCONTINENT (UNDERPADS AND DIAPERS) ×3 IMPLANT
WATER STERILE IRR 1000ML POUR (IV SOLUTION) ×3 IMPLANT
YANKAUER SUCT BULB TIP NO VENT (SUCTIONS) ×3 IMPLANT

## 2014-09-20 NOTE — Anesthesia Postprocedure Evaluation (Signed)
Anesthesia Post Note  Patient: Bailey Taylor  Procedure(s) Performed: Procedure(s) (LRB): OPEN REDUCTION INTERNAL FIXATION (ORIF) DISTAL HUMERUS FRACTURE (Right) ULNAR NERVE DECOMPRESSION/TRANSPOSITION (Right)  Anesthesia type: General  Patient location: PACU  Post pain: Pain level controlled and Adequate analgesia  Post assessment: Post-op Vital signs reviewed, Patient's Cardiovascular Status Stable, Respiratory Function Stable, Patent Airway and Pain level controlled  Last Vitals:  Filed Vitals:   09/20/14 1645  BP: 153/92  Pulse:   Temp:   Resp: 20    Post vital signs: Reviewed and stable  Level of consciousness: awake, alert  and oriented  Complications: No apparent anesthesia complications

## 2014-09-20 NOTE — Anesthesia Preprocedure Evaluation (Addendum)
Anesthesia Evaluation  Patient identified by MRN, date of birth, ID band Patient awake    Reviewed: Allergy & Precautions, NPO status , Patient's Chart, lab work & pertinent test results  History of Anesthesia Complications Negative for: history of anesthetic complications  Airway Mallampati: II  TM Distance: >3 FB Neck ROM: Full    Dental  (+) Poor Dentition, Missing, Chipped   Pulmonary neg shortness of breath, neg sleep apnea, neg COPDneg recent URI, former smoker,  breath sounds clear to auscultation        Cardiovascular hypertension, Pt. on medications - angina- Past MI and - CHF Rhythm:Regular     Neuro/Psych  Headaches,  Neuromuscular disease CVA, No Residual Symptoms negative psych ROS   GI/Hepatic Neg liver ROS, GERD-  Controlled,  Endo/Other  diabetes  Renal/GU negative Renal ROS     Musculoskeletal  (+) Fibromyalgia -  Abdominal   Peds  Hematology negative hematology ROS (+)   Anesthesia Other Findings   Reproductive/Obstetrics                            Anesthesia Physical Anesthesia Plan  ASA: III  Anesthesia Plan: General   Post-op Pain Management: GA combined w/ Regional for post-op pain   Induction: Intravenous  Airway Management Planned: Oral ETT  Additional Equipment: None  Intra-op Plan:   Post-operative Plan: Extubation in OR  Informed Consent: I have reviewed the patients History and Physical, chart, labs and discussed the procedure including the risks, benefits and alternatives for the proposed anesthesia with the patient or authorized representative who has indicated his/her understanding and acceptance.   Dental advisory given  Plan Discussed with: CRNA and Surgeon  Anesthesia Plan Comments:        Anesthesia Quick Evaluation

## 2014-09-20 NOTE — Progress Notes (Signed)
[facility] CRNA will give fentanyl to patient and is assisting Dr. Ermalene Postin with block.

## 2014-09-20 NOTE — Discharge Instructions (Signed)
Keep dressing and splint clean and dry till follow up  Non-weight bearing in the Left arm and in the sling at all times  ASA 325mg  daily for DVT prophylaxis

## 2014-09-20 NOTE — Anesthesia Procedure Notes (Addendum)
Procedure Name: Intubation Date/Time: 09/20/2014 11:01 AM Performed by: Rush Farmer E Pre-anesthesia Checklist: Patient identified, Emergency Drugs available, Suction available, Patient being monitored and Timeout performed Patient Re-evaluated:Patient Re-evaluated prior to inductionOxygen Delivery Method: Circle system utilized Preoxygenation: Pre-oxygenation with 100% oxygen Intubation Type: IV induction Ventilation: Mask ventilation without difficulty Laryngoscope Size: Mac and 4 Grade View: Grade III Tube type: Oral Tube size: 7.0 mm Number of attempts: 1 Airway Equipment and Method: Stylet Placement Confirmation: positive ETCO2 and breath sounds checked- equal and bilateral Secured at: 22 cm Tube secured with: Tape Dental Injury: Teeth and Oropharynx as per pre-operative assessment     Anesthesia Regional Block:  Interscalene brachial plexus block  Pre-Anesthetic Checklist: ,, timeout performed, Correct Patient, Correct Site, Correct Laterality, Correct Procedure, Correct Position, site marked, Risks and benefits discussed,  Surgical consent,  Pre-op evaluation,  At surgeon's request and post-op pain management  Laterality: Upper and Right  Prep: chloraprep       Needles:  Injection technique: Single-shot  Needle Type: Echogenic Stimulator Needle          Additional Needles:  Procedures: ultrasound guided (picture in chart) and nerve stimulator Interscalene brachial plexus block  Nerve Stimulator or Paresthesia:  Response: deltoid, 0.5 mA,   Additional Responses:   Narrative:  Injection made incrementally with aspirations every 5 mL.  Performed by: Personally  Anesthesiologist: Jah Alarid, CHRIS  Additional Notes: H+P and labs reviewed, risks and benefits discussed with patient, procedure tolerated well without complications

## 2014-09-20 NOTE — Op Note (Signed)
09/20/2014  3:36 PM  PATIENT:  Bailey Taylor    PRE-OPERATIVE DIAGNOSIS:  DISTAL HUMERUS FRACTURE   POST-OPERATIVE DIAGNOSIS:  Same  PROCEDURE:  OPEN REDUCTION INTERNAL FIXATION (ORIF) DISTAL HUMERUS FRACTURE, ULNAR NERVE DECOMPRESSION/TRANSPOSITION  SURGEON:  Glynis Hunsucker, Ernesta Amble, MD  ASSISTANT: Lovett Calender, PA-C, She was present and scrubbed throughout the case, critical for completion in a timely fashion, and for retraction, instrumentation, and closure.   ANESTHESIA:   gen  PREOPERATIVE INDICATIONS:  Peytin Dechert is a  74 y.o. female with a diagnosis of Vera  who failed conservative measures and elected for surgical management.    The risks benefits and alternatives were discussed with the patient preoperatively including but not limited to the risks of infection, bleeding, nerve injury, cardiopulmonary complications, the need for revision surgery, among others, and the patient was willing to proceed.  OPERATIVE IMPLANTS: variax plates and tension band  OPERATIVE FINDINGS: distal extraarticular fracture, no nerve entrapments  BLOOD LOSS: 096  COMPLICATIONS: none  TOURNIQUET TIME: 111min  OPERATIVE PROCEDURE:  Patient was identified in the preoperative holding area and site was marked by me She was transported to the operating theater and placed on the table in supine position taking care to pad all bony prominences. After a preincinduction time out anesthesia was induced. The right uppper extremity was prepped and draped in normal sterile fashion and a pre-incision timeout was performed. She received ancef for preoperative antibiotics.   She was placed in the lateral position with the beanbag placing an axial roll with the elbow on the sure foot.  I made a posterior approach to the elbow. Identified her olecranon. I identified the ulnar nerve and completely decompress her ulnar nerve coming upper cubital tunnel her ulnar nerve could easily translate  anteriorly across the medial epicondyle. It was very free and without any tension throughout the surgery. Placed a vessel loop around it and protected for the remainder of the surgery.  Next I drilled for a 45 fully threaded thumbs are partially threaded solid screw in the ulna and performed a chevron osteotomy after tapping here. I used a saw through the cortex and then finished with an osteotome to protect the cartilage. I then elevated the triceps off the posterior humerus and identified multiple fracture lines. There is primarily to fracture pieces with 1 large butterfly piece that was the medial condyle I elected to do a posterior medial plate to lessen the chances of irritating the ulnar nerve and a straight lateral plate. First I placed 2 lag screws and the butterfly piece creating a distal articular block. Next I placed 2 lag screws through this into the shaft effectively not a reduction that I was very happy with on multiple x-rays.  Next I would about the process of placing a direct lateral locking plate with 4 solid locking screws distally and 3 solid shaft screws and one in the butterfly piece that was very happy with.  Next I placed a posterior medial locking plate with 3 locking screws distally and 3 solid cyst shaft screws.  I then took multiple x-rays was very happy with the reduction and placement of all hardware there is no articular penetration. Next I replaced the chevron osteotomy and placed a 45 solid screw however did not have a very good purchase I converted to a 65 then 80 partially threaded stainless steel with a washer. Tobacco sub-I did Place 18-gauge wires in a tension band fashion and was very happy with the fixation there  is no tension at 60.  I then fired fixing the chevron I thoroughly irrigated at the fracture site I then irrigated at the surgical site and closed her skin in layers. I assess the ulnar nerve prior to that it rested in the cubital tunnel with no tension  there was no fracture fragment or hardware in or near it so I did elect to decompress it and perform a subcutaneous transposition. After this I closed the skin.  Sterile dressings were applied a long-arm splint at 45 and she was placed in a she was taking the PACU in stable condition.  POST OPERATIVE PLAN: NWB, chemical dvt px.     This note was generated using a template and dragon dictation system. In light of that, I have reviewed the note and all aspects of it are applicable to this case. Any dictation errors are due to the computerized dictation system.

## 2014-09-20 NOTE — Progress Notes (Signed)
Sign out request to Dr Truddie Coco Updated pt has mildly elevated BP

## 2014-09-20 NOTE — Transfer of Care (Signed)
Immediate Anesthesia Transfer of Care Note  Patient: Bailey Taylor  Procedure(s) Performed: Procedure(s): OPEN REDUCTION INTERNAL FIXATION (ORIF) DISTAL HUMERUS FRACTURE (Right) ULNAR NERVE DECOMPRESSION/TRANSPOSITION (Right)  Patient Location: PACU  Anesthesia Type:General  Level of Consciousness: sedated  Airway & Oxygen Therapy: Patient Spontanous Breathing and Patient connected to face mask oxygen  Post-op Assessment: Report given to RN and Post -op Vital signs reviewed and stable  Post vital signs: Reviewed and stable  Last Vitals:  Filed Vitals:   09/20/14 0854  BP: 127/78  Pulse: 110  Temp: 36.7 C  Resp: 20    Complications: No apparent anesthesia complications

## 2014-09-21 ENCOUNTER — Encounter (HOSPITAL_COMMUNITY): Payer: Self-pay | Admitting: Orthopedic Surgery

## 2014-09-21 DIAGNOSIS — S42461A Displaced fracture of medial condyle of right humerus, initial encounter for closed fracture: Secondary | ICD-10-CM | POA: Diagnosis not present

## 2014-09-21 MED ORDER — OXYCODONE-ACETAMINOPHEN 5-325 MG PO TABS: 1.0000 | ORAL_TABLET | ORAL | Status: AC | PRN

## 2014-09-21 NOTE — Progress Notes (Signed)
     Subjective:  POD#1 ORIF distal humerus with ulnar nerve decompression/ transposition. Patient reports pain as moderate.  Up to a chair this morning.   Objective:   VITALS:   Filed Vitals:   09/20/14 1830 09/20/14 2156 09/21/14 0214 09/21/14 0506  BP: 164/91 137/81 112/60 132/60  Pulse: 94 102 118 114  Temp: 98.7 F (37.1 C) 98.7 F (37.1 C) 98.2 F (36.8 C) 98.7 F (37.1 C)  TempSrc:  Oral Oral Oral  Resp:  20 20 20   Height:      Weight:      SpO2: 94% 100% 100% 100%    ABD soft Neurovascular intact Sensation intact distally Intact pulses distally Incision: dressing C/D/I R arm splinted  Lab Results  Component Value Date   WBC 9.4 09/16/2014   HGB 9.6* 09/16/2014   HCT 29.5* 09/16/2014   MCV 90.2 09/16/2014   PLT 437* 09/16/2014   BMET    Component Value Date/Time   NA 141 09/16/2014 0957   K 4.3 09/16/2014 0957   CL 105 09/16/2014 0957   CO2 26 09/16/2014 0957   GLUCOSE 110* 09/16/2014 0957   BUN 13 09/16/2014 0957   CREATININE 0.68 09/16/2014 0957   CALCIUM 9.2 09/16/2014 0957   GFRNONAA >60 09/16/2014 0957   GFRAA >60 09/16/2014 0957     Assessment/Plan: 1 Day Post-Op   Active Problems:   Fx humerus shaft-closed   Up with therapy NWB in the RUE ASA 325mg  daily for DVT prophylaxis Plan to discharge the patient home today after working with PT.  Patient is requesting stronger pain medication than norco to go home on.  Reports a rash once with percocet but would like to have that for pain control.    Zayn Selley Lelan Pons 09/21/2014, 7:50 AM Cell [phone]

## 2014-09-21 NOTE — Evaluation (Signed)
Physical Therapy Evaluation Patient Details Name: Bailey Taylor MRN: [id number] DOB: 06-12-1940 Today's Date: 09/21/2014   History of Present Illness  Bailey Taylor is a 74 y.o. female who presents for preoperative history and physical with a diagnosis of RIGHT HUMERUS FRACTURE, ULNAR NERVE INJURY. Symptoms are rated as moderate to severe, and have been worsening. This is significantly impairing activities of daily living.Pt s/p ORIF distal humerus with ulnar nerve decompression/ transposition.  Clinical Impression  Pt pleasantly confused but appropriate. Pt tolerated mobility well however due to pt's cognitive impairments and inability to use R UE functionally pt remains unsafe to d/c home alone. Pt requires 24/7 assist for safe d/c home with family.    Follow Up Recommendations Home health PT;Supervision/Assistance - 24 hour    Equipment Recommendations  None recommended by PT    Recommendations for Other Services       Precautions / Restrictions Precautions Precautions: Fall Required Braces or Orthoses: Sling (for ambulation) Restrictions Weight Bearing Restrictions: Yes RUE Weight Bearing: Non weight bearing LUE Weight Bearing:  (n/a)      Mobility  Bed Mobility               General bed mobility comments: Pt found seated in recliner upon PT entering room  Transfers Overall transfer level: Needs assistance Equipment used: None Transfers: Sit to/from Stand Sit to Stand: Mod assist Stand pivot transfers: Mod assist;+2 physical assistance       General transfer comment: Suspect due to increased pain. Mod assist for lift and lower. Cues for sequencing and technique.   Ambulation/Gait Ambulation/Gait assistance: Min guard;Min assist Ambulation Distance (Feet): 200 Feet Assistive device: Straight cane Gait Pattern/deviations: Step-through pattern;Decreased stride length;Wide base of support   Gait velocity interpretation: at or above normal speed for  age/gender General Gait Details: pt mildly unsteady but no episodes of LOB, pt unable to navigate back to the room due to poor memory  Stairs Stairs:  (discussed stairs with dtr, dtr practiced and return demod)          Wheelchair Mobility    Modified Rankin (Stroke Patients Only)       Balance Overall balance assessment: Needs assistance Sitting-balance support: Feet supported;Single extremity supported Sitting balance-Leahy Scale: Fair     Standing balance support: Single extremity supported Standing balance-Leahy Scale: Poor                               Pertinent Vitals/Pain Pain Assessment: Faces Faces Pain Scale: Hurts even more Pain Location: R UE Pain Descriptors / Indicators: Grimacing;Guarding Pain Intervention(s): Monitored during session (placed in sling for ambulation)    Home Living Family/patient expects to be discharged to:: Private residence (daughters house in [place]) Living Arrangements: Children;Other relatives Available Help at Discharge: Family;Available 24 hours/day (daughter and son-in-law and granddaughter) Type of Home: House Home Access: Stairs to enter Entrance Stairs-Rails: None Entrance Stairs-Number of Steps: 3 Home Layout: One level Home Equipment: Bedside commode;Cane - single point      Prior Function Level of Independence: Independent with assistive device(s)         Comments: Pt overall mod I PTA (prior to fall). Daughter states pt lived a pretty sedentary lifestyle      Hand Dominance   Dominant Hand: Right    Extremity/Trunk Assessment   Upper Extremity Assessment: Defer to OT evaluation (R UE in splint due to humerus fracture) RUE Deficits / Details: recent humerus fracture RUE:  Unable to fully assess due to pain;Unable to fully assess due to immobilization       Lower Extremity Assessment: Overall WFL for tasks assessed      Cervical / Trunk Assessment: Normal  Communication   Communication: No  difficulties  Cognition Arousal/Alertness: Lethargic;Suspect due to medications Behavior During Therapy: [facility] for tasks assessed/performed Overall Cognitive Status: Within Functional Limits for tasks assessed                      General Comments General comments (skin integrity, edema, etc.): educated on importance of elevating R UE and the importance of keeping the wrap on.    Exercises        Assessment/Plan    PT Assessment Patient needs continued PT services  PT Diagnosis Difficulty walking;Generalized weakness;Acute pain   PT Problem List Decreased strength;Decreased range of motion;Decreased activity tolerance;Decreased balance;Decreased mobility;Decreased safety awareness  PT Treatment Interventions DME instruction;Gait training;Stair training;Functional mobility training;Therapeutic activities;Therapeutic exercise   PT Goals (Current goals can be found in the Care Plan section) Acute Rehab PT Goals Patient Stated Goal: none stated PT Goal Formulation: With patient Potential to Achieve Goals: Good    Frequency Min 3X/week   Barriers to discharge        Co-evaluation               End of Session Equipment Utilized During Treatment: Gait belt (sling) Activity Tolerance: Patient tolerated treatment well Patient left: in chair;with call bell/phone within reach;with family/visitor present Nurse Communication: Mobility status    Functional Assessment Tool Used: clincial jdugement Functional Limitation: Mobility: Walking and moving around Mobility: Walking and Moving Around Current Status (I3254): At least 20 percent but less than 40 percent impaired, limited or restricted Mobility: Walking and Moving Around Goal Status [phone]): At least 1 percent but less than 20 percent impaired, limited or restricted    Time: 1032-1102 PT Time Calculation (min) (ACUTE ONLY): 30 min   Charges:   PT Evaluation $Initial PT Evaluation Tier I: 1 Procedure PT  Treatments $Gait Training: 8-22 mins   PT G Codes:   PT G-Codes **NOT FOR INPATIENT CLASS** Functional Assessment Tool Used: clincial jdugement Functional Limitation: Mobility: Walking and moving around Mobility: Walking and Moving Around Current Status (B5830): At least 20 percent but less than 40 percent impaired, limited or restricted Mobility: Walking and Moving Around Goal Status [phone]): At least 1 percent but less than 20 percent impaired, limited or restricted    Kingsley Callander 09/21/2014, 11:26 AM  Kittie Plater, PT, DPT Pager #: [phone] Office #: [phone]

## 2014-09-21 NOTE — Progress Notes (Signed)
Patient discharged to home with instructions given to daughter, also instructed to go to Dr. Debroah Loop clinic after discharge as per MD.

## 2014-09-21 NOTE — Evaluation (Signed)
Occupational Therapy Evaluation Patient Details Name: Bailey Taylor MRN: [id number] DOB: 17-Mar-1940 Today's Date: 09/21/2014    History of Present Illness Bailey Taylor is a 74 y.o. female who presents for preoperative history and physical with a diagnosis of RIGHT HUMERUS FRACTURE, ULNAR NERVE INJURY. Symptoms are rated as moderate to severe, and have been worsening. This is significantly impairing activities of daily living.Pt s/p ORIF distal humerus with ulnar nerve decompression/ transposition.   Clinical Impression   Patient presenting with decreased ADL and functional mobility independence secondary to above. Patient mod I PTA, prior to fall. Patient currently requires up to total assist for ADLs and up to mod assist +2 for functional mobility/transfers. Patient will benefit from acute OT to increase overall independence in the areas of ADLs, functional mobility, and overall safety in order to safely discharge to daughters house. Pt from [place], but does not plan to go back until MD clears her to travel/fly and all arrangements are set up with doctors and therapists.     Follow Up Recommendations  Home health OT;Supervision/Assistance - 24 hour    Equipment Recommendations  None recommended by OT    Recommendations for Other Services  None at this time   Precautions / Restrictions Precautions Precautions: Fall Restrictions Weight Bearing Restrictions: Yes RUE Weight Bearing: Non weight bearing LUE Weight Bearing:  (n/a)    Mobility Bed Mobility General bed mobility comments: Pt found seated in recliner upon OT entering room  Transfers Overall transfer level: Needs assistance Equipment used: None Transfers: Sit to/from [employer] Transfers Sit to Stand: Mod assist;+2 physical assistance Stand pivot transfers: Mod assist;+2 physical assistance       General transfer comment: Suspect due to increased pain. Mod assist for lift and lower. Cues for sequencing  and technique.     Balance Overall balance assessment: Needs assistance Sitting-balance support: No upper extremity supported;Feet supported Sitting balance-Leahy Scale: Fair     Standing balance support: No upper extremity supported;During functional activity Standing balance-Leahy Scale: Poor    ADL Overall ADL's : Needs assistance/impaired General ADL Comments: Pt requires overall total assist for ADLs due to RUE immobilization. Educated patient and daughter on compensatory dressing techniques. Pt performed BSC transfer with mod assist +2 secondary to increased pain. As pain medicine seemed to be kicking in, pt min assist with +2 for safety. Plan is for patient to discharge back to her daughters house until MD clears pt to travel/fly back to [place].     Pertinent Vitals/Pain Pain Assessment: Faces Faces Pain Scale: Hurts whole lot Pain Location: RUE (upon OT entering room)  Pain Descriptors / Indicators: Grimacing;Guarding;Shooting Pain Intervention(s): Limited activity within patient's tolerance;Monitored during session;Repositioned;Premedicated before session     Hand Dominance Right   Extremity/Trunk Assessment Upper Extremity Assessment Upper Extremity Assessment: RUE deficits/detail RUE Deficits / Details: recent humerus fracture RUE: Unable to fully assess due to pain;Unable to fully assess due to immobilization   Lower Extremity Assessment Lower Extremity Assessment: Defer to PT evaluation       Communication Communication Communication: No difficulties   Cognition Arousal/Alertness: Lethargic;Suspect due to medications Behavior During Therapy: [facility] for tasks assessed/performed Overall Cognitive Status: Within Functional Limits for tasks assessed              Home Living Family/patient expects to be discharged to:: Private residence (daughters house in [place]) Living Arrangements: Children;Other relatives Available Help at Discharge: Family;Available 24 hours/day  (daughter and son-in-law and granddaughter) Type of Home: House Home Access: Stairs to  enter Entrance Stairs-Number of Steps: 3 Entrance Stairs-Rails: None Home Layout: One level     Bathroom Shower/Tub: Teacher, early years/pre: Standard     Home Equipment: Bedside commode;Cane - single point   Prior Functioning/Environment Level of Independence: Independent with assistive device(s)        Comments: Pt overall mod I PTA (prior to fall). Daughter states pt lived a pretty sedentary lifestyle     OT Diagnosis: Generalized weakness;Acute pain   OT Problem List: Decreased strength;Decreased range of motion;Decreased activity tolerance;Impaired balance (sitting and/or standing);Decreased safety awareness;Pain;Decreased knowledge of precautions;Decreased knowledge of use of DME or AE   OT Treatment/Interventions: Self-care/ADL training;Therapeutic exercise;Energy conservation;DME and/or AE instruction;Therapeutic activities;Patient/family education;Balance training    OT Goals(Current goals can be found in the care plan section) Acute Rehab OT Goals Patient Stated Goal: none stated OT Goal Formulation: With patient/family Time For Goal Achievement: 10/05/14 Potential to Achieve Goals: Good ADL Goals Pt Will Perform Grooming: with supervision;standing Pt Will Perform Upper Body Bathing: with min assist;sitting Pt Will Perform Upper Body Dressing: with min assist;sitting Pt Will Transfer to Toilet: with min assist;bedside commode Pt/caregiver will Perform Home Exercise Program: Increased ROM;Increased strength;Left upper extremity;With minimal assist;With written HEP provided (shoulder and fingers only)  OT Frequency: Min 2X/week   Barriers to D/C: None known at this time   End of Session Equipment Utilized During Treatment: Gait belt  Activity Tolerance: Patient limited by pain Patient left: in chair;with call bell/phone within reach;with family/visitor present    Time: 0912-0945 OT Time Calculation (min): 33 min Charges:  OT General Charges $OT Visit: 1 Procedure OT Evaluation $Initial OT Evaluation Tier I: 1 Procedure OT Treatments $Self Care/Home Management : 8-22 mins G-Codes: OT G-codes **NOT FOR INPATIENT CLASS** Functional Limitation: Self care Self Care Current Status (X9147): At least 80 percent but less than 100 percent impaired, limited or restricted Self Care Goal Status (W2956): At least 20 percent but less than 40 percent impaired, limited or restricted  Shoni Quijas , MS, OTR/L, CLT Pager: [phone]  09/21/2014, 10:04 AM

## 2014-09-22 NOTE — Discharge Summary (Signed)
Physician Discharge Summary  Patient ID: Bailey Taylor MRN: [id number] DOB/AGE: 06-15-1940 74 y.o.  Admit date: 09/20/2014 Discharge date: 09/21/2014  Admission Diagnoses:  Fx humerus shaft-closed  Discharge Diagnoses:  Principal Problem:   Fx humerus shaft-closed   Past Medical History  Diagnosis Date  . Hypertension   . Migraine   . Alzheimer disease   . Dementia     dx 2011 in [place]  . Stroke     x 3   (don't know yrs--in [place])   no deficits  . Diabetes mellitus without complication     borderline, on no meds, change in diet  . Chronic kidney disease     has stress incontinence  . GERD (gastroesophageal reflux disease)     yrs ago  . Fibromyalgia   . Cancer     left breast cancer  . Pneumonia     Surgeries: Procedure(s): OPEN REDUCTION INTERNAL FIXATION (ORIF) DISTAL HUMERUS FRACTURE ULNAR NERVE DECOMPRESSION/TRANSPOSITION on 09/20/2014   Consultants (if any):    Discharged Condition: Improved  Hospital Course: Bailey Taylor is an 74 y.o. female who was admitted 09/20/2014 with a diagnosis of Fx humerus shaft-closed and went to the operating room on 09/20/2014 and underwent the above named procedures.    She was given perioperative antibiotics:      Anti-infectives    Start     Dose/Rate Route Frequency Ordered Stop   09/20/14 1930  ceFAZolin (ANCEF) IVPB 2 g/50 mL premix     2 g 100 mL/hr over 30 Minutes Intravenous Every 6 hours 09/20/14 1850 09/21/14 0847   09/20/14 0945  ceFAZolin (ANCEF) IVPB 2 g/50 mL premix     2 g 100 mL/hr over 30 Minutes Intravenous On call to O.R. 09/20/14 0859 09/20/14 1115   09/20/14 0901  ceFAZolin (ANCEF) 2-3 GM-% IVPB SOLR    Comments:  Bailey Taylor   : cabinet override      09/20/14 0901 09/20/14 2114    .  She was given sequential compression devices, early ambulation, and ASA 325mg  for DVT prophylaxis.  She benefited maximally from the hospital stay and there were no complications.    Recent vital signs:  Filed Vitals:    09/21/14 0506  BP: 132/60  Pulse: 114  Temp: 98.7 F (37.1 C)  Resp: 20    Recent laboratory studies:  Lab Results  Component Value Date   HGB 9.6* 09/16/2014   Lab Results  Component Value Date   WBC 9.4 09/16/2014   PLT 437* 09/16/2014   No results found for: INR Lab Results  Component Value Date   NA 141 09/16/2014   K 4.3 09/16/2014   CL 105 09/16/2014   CO2 26 09/16/2014   BUN 13 09/16/2014   CREATININE 0.68 09/16/2014   GLUCOSE 110* 09/16/2014    Discharge Medications:     Medication List    STOP taking these medications        acetaminophen 650 MG CR tablet  Commonly known as:  TYLENOL     HYDROcodone-acetaminophen 5-325 MG per tablet  Commonly known as:  NORCO/VICODIN      TAKE these medications        aspirin 325 MG tablet  Take 1 tablet (325 mg total) by mouth daily.     butalbital-acetaminophen-caffeine 50-325-40 MG per tablet  Commonly known as:  FIORICET, ESGIC  Take 1 tablet by mouth 3 (three) times daily as needed for headache.     diphenhydrAMINE 25 mg capsule  Commonly  known as:  BENADRYL  Take 25 mg by mouth every 6 (six) hours as needed for allergies.     docusate sodium 100 MG capsule  Commonly known as:  COLACE  Take 1 capsule (100 mg total) by mouth 2 (two) times daily.     lisinopril-hydrochlorothiazide 20-25 MG per tablet  Commonly known as:  PRINZIDE,ZESTORETIC  Take 1 tablet by mouth daily.     loperamide 2 MG capsule  Commonly known as:  IMODIUM  Take 2 mg by mouth as needed for diarrhea or loose stools.     multivitamin with minerals Tabs tablet  Take 1 tablet by mouth daily.     ondansetron 4 MG tablet  Commonly known as:  ZOFRAN  Take 1 tablet (4 mg total) by mouth every 8 (eight) hours as needed for nausea or vomiting.     oxyCODONE-acetaminophen 5-325 MG per tablet  Commonly known as:  PERCOCET  Take 1-2 tablets by mouth every 4 (four) hours as needed for severe pain.     venlafaxine 75 MG tablet   Commonly known as:  EFFEXOR  Take 75 mg by mouth 2 (two) times daily.        Diagnostic Studies: Dg Shoulder Right  09/07/2014   CLINICAL DATA:  Tripped and fell at home tonight.  EXAM: RIGHT SHOULDER - 2+ VIEW  COMPARISON:  None.  FINDINGS: Limited views of the right shoulder are negative for acute fracture or dislocation.  IMPRESSION: Negative for acute fracture about the shoulder.   Electronically Signed   By: Andreas Newport M.D.   On: 09/07/2014 19:19   Dg Elbow 2 Views Right  09/20/2014   CLINICAL DATA:  Status post open reduction internal fixation for fracture  EXAM: RIGHT ELBOW - 2 VIEW  COMPARISON:  September 07, 2014  FINDINGS: Frontal and lateral views were obtained. There is screw and plate fixation in the distal humerus as well as screw and wire fixation traversing the elbow joint. The comminuted fracture of the distal humerus is in essentially anatomic alignment. There is no demonstrable dislocation. Joint spaces appear grossly intact. Screw and plate fixation devices appear well seated.  IMPRESSION: Alignment at this site of the distal humeral fracture is essentially anatomic. No new fracture. No dislocation. The screw and plate fixation devices appear well seated.   Electronically Signed   By: Lowella Grip III M.D.   On: 09/20/2014 19:09   Ct Head Wo Contrast  09/07/2014   CLINICAL DATA:  Fall.  Syncope  EXAM: CT HEAD WITHOUT CONTRAST  CT CERVICAL SPINE WITHOUT CONTRAST  TECHNIQUE: Multidetector CT imaging of the head and cervical spine was performed following the standard protocol without intravenous contrast. Multiplanar CT image reconstructions of the cervical spine were also generated.  COMPARISON:  None.  FINDINGS: CT HEAD FINDINGS  Patchy low attenuation within the subcortical and periventricular white matter noted compatible with chronic microvascular disease. No evidence for acute cortical infarct, intracranial hemorrhage or mass. No abnormal extra-axial fluid collections  noted. The paranasal stress set there is partial opacification of the sphenoid sinus. The paranasal sinuses are otherwise clear. The mastoid air cells are clear. The calvarium appears intact.  CT CERVICAL SPINE FINDINGS  Straightening of normal cervical lordosis. Multi level disc space narrowing and ventral endplate spurring is noted throughout the cervical spine. There is an anterolisthesis of C3 on C4 which is likely related to spondylosis. The facet joints are all well aligned. No fracture or subluxation identified.  IMPRESSION: 1. No  acute intracranial abnormalities. 2. Small vessel ischemic change. 3. Advanced cervical spondylosis 4. No evidence for cervical spine fracture or dislocation.   Electronically Signed   By: Kerby Moors M.D.   On: 09/07/2014 20:29   Ct Cervical Spine Wo Contrast  09/07/2014   CLINICAL DATA:  Fall.  Syncope  EXAM: CT HEAD WITHOUT CONTRAST  CT CERVICAL SPINE WITHOUT CONTRAST  TECHNIQUE: Multidetector CT imaging of the head and cervical spine was performed following the standard protocol without intravenous contrast. Multiplanar CT image reconstructions of the cervical spine were also generated.  COMPARISON:  None.  FINDINGS: CT HEAD FINDINGS  Patchy low attenuation within the subcortical and periventricular white matter noted compatible with chronic microvascular disease. No evidence for acute cortical infarct, intracranial hemorrhage or mass. No abnormal extra-axial fluid collections noted. The paranasal stress set there is partial opacification of the sphenoid sinus. The paranasal sinuses are otherwise clear. The mastoid air cells are clear. The calvarium appears intact.  CT CERVICAL SPINE FINDINGS  Straightening of normal cervical lordosis. Multi level disc space narrowing and ventral endplate spurring is noted throughout the cervical spine. There is an anterolisthesis of C3 on C4 which is likely related to spondylosis. The facet joints are all well aligned. No fracture or  subluxation identified.  IMPRESSION: 1. No acute intracranial abnormalities. 2. Small vessel ischemic change. 3. Advanced cervical spondylosis 4. No evidence for cervical spine fracture or dislocation.   Electronically Signed   By: Kerby Moors M.D.   On: 09/07/2014 20:29   Ct Humerus Right Wo Contrast  09/09/2014   CLINICAL DATA:  Distal RIGHT humerus fracture.  Fall 09/07/2014.  EXAM: CT OF THE RIGHT HUMERUS WITHOUT CONTRAST  TECHNIQUE: Multidetector CT imaging was performed according to the standard protocol. Multiplanar CT image reconstructions were also generated.  COMPARISON:  None.  FINDINGS: The study was technically difficult due to patient body habitus. Because of patient's size, the patient could not be centered in the center of the bore. The distal humerus injury complicated positioning of the patient due to discomfort. The study is diagnostic.  The proximal humerus, scapula and ribs are within normal limits. There is a comminuted distal humerus fracture. There is a butterfly fragment off the medial aspect of the distal humeral metaphysis. 1 cm medial displacement of the distal humerus relative to the shaft. Mild apex dorsal angulation is present. Dorsal displacement of the distal humerus relative to the shaft is also about 1 cm. The dominant fracture planes are obliquely oriented. There is no extension to the articular surface. Olecranon, proximal radius intact. Soft tissue swelling associated with the trauma is present around the ankle.  IMPRESSION: Moderately displaced comminuted distal humeral metaphyseal fracture with medial butterfly fragment.   Electronically Signed   By: Dereck Ligas M.D.   On: 09/09/2014 18:16   Ct 3d Independent Darreld Mclean  09/09/2014   CLINICAL DATA:  Abnormal findings on recent x-ray imaging which demonstrated a fracture of fracture of the distal RIGHT humerus.  EXAM: 3-DIMENSIONAL CT IMAGE RENDERING ON INDEPENDENT WORKSTATION  TECHNIQUE: 3-dimensional CT images were  rendered by post-processing of the original CT data at independent workstation.  FINDINGS: Three dimensional reconstructions were performed by the radiologist at an independent workstation.  IMPRESSION: As above.   Electronically Signed   By: Dereck Ligas M.D.   On: 09/09/2014 18:17   Dg Humerus Right  09/07/2014   CLINICAL DATA:  Tripped and fell at home tonight.  EXAM: RIGHT HUMERUS - 2+ VIEW  COMPARISON:  None.  FINDINGS: There is a comminuted fracture of the distal humeral diaphysis with mild angulation and with mild medial displacement of a butterfly fragment. The fracture appears to spare the articulation at the elbow. There is no bone lesion to suggest a pathologic basis of the fracture.  IMPRESSION: Mildly comminuted distal diaphyseal fracture of the humerus.   Electronically Signed   By: Andreas Newport M.D.   On: 09/07/2014 19:18    Disposition: 01-Home or Self Care  Discharge Instructions    Non weight bearing    Complete by:  As directed   Laterality:  left  Extremity:  Upper           Follow-up Information    Follow up with MURPHY, TIMOTHY D, MD In 1 week.   Specialty:  Orthopedic Surgery   Contact information:   [address] [place] [id number] [phone]        Signed: Gae Dry 09/22/2014, 12:58 PM Cell [phone]

## 2014-09-26 ENCOUNTER — Encounter (HOSPITAL_COMMUNITY): Payer: Self-pay | Admitting: Orthopedic Surgery

## 2014-09-28 ENCOUNTER — Encounter (HOSPITAL_COMMUNITY): Payer: Self-pay | Admitting: Orthopedic Surgery

## 2014-09-29 ENCOUNTER — Encounter (HOSPITAL_COMMUNITY): Payer: Self-pay | Admitting: Orthopedic Surgery

## 2014-10-06 ENCOUNTER — Encounter (HOSPITAL_COMMUNITY): Payer: Self-pay | Admitting: Orthopedic Surgery

## 2014-10-13 ENCOUNTER — Encounter (HOSPITAL_COMMUNITY): Payer: Self-pay | Admitting: Orthopedic Surgery

## 2014-12-06 ENCOUNTER — Emergency Department (HOSPITAL_COMMUNITY)
Admission: EM | Admit: 2014-12-06 | Discharge: 2014-12-06 | Discharge status: Absent without leave | Payer: Medicare HMO | Source: Home / Self Care

## 2014-12-06 ENCOUNTER — Encounter (HOSPITAL_COMMUNITY): Payer: Self-pay | Admitting: Emergency Medicine

## 2014-12-06 ENCOUNTER — Emergency Department (HOSPITAL_COMMUNITY): Payer: Commercial Managed Care - HMO

## 2014-12-06 ENCOUNTER — Emergency Department (HOSPITAL_COMMUNITY)
Admission: EM | Admit: 2014-12-06 | Discharge: 2014-12-06 | Disposition: A | Discharge status: Home / Self Care | Payer: Commercial Managed Care - HMO | Attending: Emergency Medicine | Admitting: Emergency Medicine

## 2014-12-06 DIAGNOSIS — Z87891 Personal history of nicotine dependence: Secondary | ICD-10-CM | POA: Diagnosis not present

## 2014-12-06 DIAGNOSIS — Z8701 Personal history of pneumonia (recurrent): Secondary | ICD-10-CM | POA: Insufficient documentation

## 2014-12-06 DIAGNOSIS — N390 Urinary tract infection, site not specified: Secondary | ICD-10-CM | POA: Insufficient documentation

## 2014-12-06 DIAGNOSIS — N189 Chronic kidney disease, unspecified: Secondary | ICD-10-CM | POA: Diagnosis not present

## 2014-12-06 DIAGNOSIS — Z8673 Personal history of transient ischemic attack (TIA), and cerebral infarction without residual deficits: Secondary | ICD-10-CM | POA: Insufficient documentation

## 2014-12-06 DIAGNOSIS — Z853 Personal history of malignant neoplasm of breast: Secondary | ICD-10-CM | POA: Insufficient documentation

## 2014-12-06 DIAGNOSIS — F0390 Unspecified dementia without behavioral disturbance: Secondary | ICD-10-CM | POA: Diagnosis not present

## 2014-12-06 DIAGNOSIS — G4751 Confusional arousals: Secondary | ICD-10-CM | POA: Insufficient documentation

## 2014-12-06 DIAGNOSIS — Z79899 Other long term (current) drug therapy: Secondary | ICD-10-CM | POA: Insufficient documentation

## 2014-12-06 DIAGNOSIS — Z7982 Long term (current) use of aspirin: Secondary | ICD-10-CM | POA: Diagnosis not present

## 2014-12-06 DIAGNOSIS — R410 Disorientation, unspecified: Secondary | ICD-10-CM

## 2014-12-06 DIAGNOSIS — G43909 Migraine, unspecified, not intractable, without status migrainosus: Secondary | ICD-10-CM | POA: Insufficient documentation

## 2014-12-06 DIAGNOSIS — E119 Type 2 diabetes mellitus without complications: Secondary | ICD-10-CM | POA: Insufficient documentation

## 2014-12-06 DIAGNOSIS — I129 Hypertensive chronic kidney disease with stage 1 through stage 4 chronic kidney disease, or unspecified chronic kidney disease: Secondary | ICD-10-CM | POA: Diagnosis not present

## 2014-12-06 DIAGNOSIS — Z8719 Personal history of other diseases of the digestive system: Secondary | ICD-10-CM | POA: Diagnosis not present

## 2014-12-06 DIAGNOSIS — R4182 Altered mental status, unspecified: Secondary | ICD-10-CM | POA: Diagnosis present

## 2014-12-06 LAB — CBC
HEMATOCRIT: 30.1 % — AB (ref 36.0–46.0)
HEMOGLOBIN: 10 g/dL — AB (ref 12.0–15.0)
MCH: 29.6 pg (ref 26.0–34.0)
MCHC: 33.2 g/dL (ref 30.0–36.0)
MCV: 89.1 fL (ref 78.0–100.0)
Platelets: 289 10*3/uL (ref 150–400)
RBC: 3.38 MIL/uL — AB (ref 3.87–5.11)
RDW: 17.1 % — ABNORMAL HIGH (ref 11.5–15.5)
WBC: 9.2 10*3/uL (ref 4.0–10.5)

## 2014-12-06 LAB — COMPREHENSIVE METABOLIC PANEL
ALBUMIN: 2.9 g/dL — AB (ref 3.5–5.0)
ALK PHOS: 96 U/L (ref 38–126)
ALT: 12 U/L — AB (ref 14–54)
AST: 21 U/L (ref 15–41)
Anion gap: 11 (ref 5–15)
BILIRUBIN TOTAL: 0.6 mg/dL (ref 0.3–1.2)
BUN: 17 mg/dL (ref 6–20)
CO2: 21 mmol/L — ABNORMAL LOW (ref 22–32)
Calcium: 9 mg/dL (ref 8.9–10.3)
Chloride: 108 mmol/L (ref 101–111)
Creatinine, Ser: 1.33 mg/dL — ABNORMAL HIGH (ref 0.44–1.00)
GFR calc Af Amer: 45 mL/min — ABNORMAL LOW (ref >60)
GFR calc non Af Amer: 39 mL/min — ABNORMAL LOW (ref >60)
GLUCOSE: 92 mg/dL (ref 65–99)
Potassium: 3.5 mmol/L (ref 3.5–5.1)
Sodium: 140 mmol/L (ref 135–145)
Total Protein: 6.3 g/dL — ABNORMAL LOW (ref 6.5–8.1)

## 2014-12-06 LAB — URINALYSIS, ROUTINE W REFLEX MICROSCOPIC
Bilirubin Urine: NEGATIVE
Glucose, UA: NEGATIVE mg/dL
Hgb urine dipstick: NEGATIVE
Ketones, ur: NEGATIVE mg/dL
NITRITE: NEGATIVE
PH: 5 (ref 5.0–8.0)
Protein, ur: NEGATIVE mg/dL
SPECIFIC GRAVITY, URINE: 1.022 (ref 1.005–1.030)
Urobilinogen, UA: 0.2 mg/dL (ref 0.0–1.0)

## 2014-12-06 LAB — URINE MICROSCOPIC-ADD ON

## 2014-12-06 MED ORDER — CEPHALEXIN 500 MG PO CAPS: 500.0000 mg | ORAL_CAPSULE | Freq: Three times a day (TID) | ORAL | Status: AC

## 2014-12-06 MED ORDER — CEPHALEXIN 250 MG PO CAPS
500.0000 mg | ORAL_CAPSULE | Freq: Once | ORAL | Status: AC
  Administered 2014-12-06: 500 mg via ORAL
  Filled 2014-12-06: qty 2

## 2014-12-06 NOTE — ED Notes (Signed)
pts son states that he is concerned patient has dementia, states that she does not know her age and calls him and his wife the wrong names and thinks they are other people in the family. Pt is oriented to person and place but disoriented to time and situation. pts son reports memory issues in patient for the past 6 months to 1 year.

## 2014-12-06 NOTE — ED Notes (Signed)
Patient transported to CT 

## 2014-12-06 NOTE — ED Notes (Signed)
Pt here for eval of worsening dementia sx; pt currently thinks it is 2010; per family becoming more frequent and severe

## 2014-12-06 NOTE — Discharge Instructions (Signed)
It was our pleasure to provide your ER care today - we hope that you feel better.  Rest. Drink adequate fluids.  Your urine shows possible urine infection - take antibiotic as prescribed.  Follow up with your primary care doctor in coming week.  Also follow up with neurologist in the next 1-2 weeks  - see referral - call office to arrange appointment.  Return to ER if worse, new symptoms, fevers, trouble breathing, other concern.    Dementia Dementia is a general term for problems with brain function. A person with dementia has memory loss and a hard time with at least one other brain function such as thinking, speaking, or problem solving. Dementia can affect social functioning, how you do your job, your mood, or your personality. The changes may be hidden for a long time. The earliest forms of this disease are usually not detected by family or friends. Dementia can be:  Irreversible.  Potentially reversible.  Partially reversible.  Progressive. This means it can get worse over time. CAUSES  Irreversible dementia causes may include:  Degeneration of brain cells (Alzheimer disease or Lewy body dementia).  Multiple small strokes (vascular dementia).  Infection (chronic meningitis or Creutzfeldt-Jakob disease).  Frontotemporal dementia. This affects younger people, age 51 to 65, compared to those who have Alzheimer disease.  Dementia associated with other disorders like Parkinson disease, Huntington disease, or HIV-associated dementia. Potentially or partially reversible dementia causes may include:  Medicines.  Metabolic causes such as excessive alcohol intake, vitamin B12 deficiency, or thyroid disease.  Masses or pressure in the brain such as a tumor, blood clot, or hydrocephalus. SIGNS AND SYMPTOMS  Symptoms are often hard to detect. Family members or coworkers may not notice them early in the disease process. Different people with dementia may have different symptoms.  Symptoms can include:  A hard time with memory, especially recent memory. Long-term memory may not be impaired.  Asking the same question multiple times or forgetting something someone just said.  A hard time speaking your thoughts or finding certain words.  A hard time solving problems or performing familiar tasks (such as how to use a telephone).  Sudden changes in mood.  Changes in personality, especially increasing moodiness or mistrust.  Depression.  A hard time understanding complex ideas that were never a problem in the past. DIAGNOSIS  There are no specific tests for dementia.   Your health care provider may recommend a thorough evaluation. This is because some forms of dementia can be reversible. The evaluation will likely include a physical exam and getting a detailed history from you and a family member. The history often gives the best clues and suggestions for a diagnosis.  Memory testing may be done. A detailed brain function evaluation called neuropsychologic testing may be helpful.  Lab tests and brain imaging (such as a CT scan or MRI scan) are sometimes important.  Sometimes observation and re-evaluation over time is very helpful. TREATMENT  Treatment depends on the cause.   If the problem is a vitamin deficiency, it may be helped or cured with supplements.  For dementias such as Alzheimer disease, medicines are available to stabilize or slow the course of the disease. There are no cures for this type of dementia.  Your health care provider can help direct you to groups, organizations, and other health care providers to help with decisions in the care of you or your loved one. HOME CARE INSTRUCTIONS The care of individuals with dementia is varied and  dependent upon the progression of the dementia. The following suggestions are intended for the person living with, or caring for, the person with dementia.  Create a safe environment.  Remove the locks on  bathroom doors to prevent the person from accidentally locking himself or herself in.  Use childproof latches on kitchen cabinets and any place where cleaning supplies, chemicals, or alcohol are kept.  Use childproof covers in unused electrical outlets.  Install childproof devices to keep doors and windows secured.  Remove stove knobs or install safety knobs and an automatic shut-off on the stove.  Lower the temperature on water heaters.  Label medicines and keep them locked up.  Secure knives, lighters, matches, power tools, and guns, and keep these items out of reach.  Keep the house free from clutter. Remove rugs or anything that might contribute to a fall.  Remove objects that might break and hurt the person.  Make sure lighting is good, both inside and outside.  Install grab rails as needed.  Use a monitoring device to alert you to falls or other needs for help.  Reduce confusion.  Keep familiar objects and people around.  Use night lights or dim lights at night.  Label items or areas.  Use reminders, notes, or directions for daily activities or tasks.  Keep a simple, consistent routine for waking, meals, bathing, dressing, and bedtime.  Create a calm, quiet environment.  Place large clocks and calendars prominently.  Display emergency numbers and home address near all telephones.  Use cues to establish different times of the day. An example is to open curtains to let the natural light in during the day.   Use effective communication.  Choose simple words and short sentences.  Use a gentle, calm tone of voice.  Be careful not to interrupt.  If the person is struggling to find a word or communicate a thought, try to provide the word or thought.  Ask one question at a time. Allow the person ample time to answer questions. Repeat the question again if the person does not respond.  Reduce nighttime restlessness.  Provide a comfortable bed.  Have a  consistent nighttime routine.  Ensure a regular walking or physical activity schedule. Involve the person in daily activities as much as possible.  Limit napping during the day.  Limit caffeine.  Attend social events that stimulate rather than overwhelm the senses.  Encourage good nutrition and hydration.  Reduce distractions during meal times and snacks.  Avoid foods that are too hot or too cold.  Monitor chewing and swallowing ability.  Continue with routine vision, hearing, dental, and medical screenings.  Give medicines only as directed by the health care provider.  Monitor driving abilities. Do not allow the person to drive when safe driving is no longer possible.  Register with an identification program which could provide location assistance in the event of a missing person situation. SEEK MEDICAL CARE IF:   New behavioral problems start such as moodiness, aggressiveness, or seeing things that are not there (hallucinations).  Any new problem with brain function happens. This includes problems with balance, speech, or falling a lot.  Problems with swallowing develop.  Any symptoms of other illness happen. Small changes or worsening in any aspect of brain function can be a sign that the illness is getting worse. It can also be a sign of another medical illness such as infection. Seeing a health care provider right away is important. SEEK IMMEDIATE MEDICAL CARE IF:  A fever develops.  New or worsened confusion develops.  New or worsened sleepiness develops.  Staying awake becomes hard to do.   This information is not intended to replace advice given to you by your health care provider. Make sure you discuss any questions you have with your health care provider.   Document Released: 07/24/2000 Document Revised: 02/18/2014 Document Reviewed: 06/25/2010 Elsevier Interactive Patient Education 2016 [employer].    Confusion Confusion is the inability to think  with your usual speed or clarity. Confusion may come on quickly or slowly over time. How quickly the confusion comes on depends on the cause. Confusion can be due to any number of causes. CAUSES   Concussion, head injury, or head trauma.  Seizures.  Stroke.  Fever.  Brain tumor.  Age related decreased brain function (dementia).  Heightened emotional states like rage or terror.  Mental illness in which the person loses the ability to determine what is real and what is not (hallucinations).  Infections such as a urinary tract infection (UTI).  Toxic effects from alcohol, drugs, or prescription medicines.  Dehydration and an imbalance of salts in the body (electrolytes).  Lack of sleep.  Low blood sugar (diabetes).  Low levels of oxygen from conditions such as chronic lung disorders.  Drug interactions or other medicine side effects.  Nutritional deficiencies, especially niacin, thiamine, vitamin C, or vitamin B.  Sudden drop in body temperature (hypothermia).  Change in routine, such as when traveling or hospitalized. SIGNS AND SYMPTOMS  People often describe their thinking as cloudy or unclear when they are confused. Confusion can also include feeling disoriented. That means you are unaware of where or who you are. You may also not know what the date or time is. If confused, you may also have difficulty paying attention, remembering, and making decisions. Some people also act aggressively when they are confused.  DIAGNOSIS  The medical evaluation of confusion may include:  Blood and urine tests.  X-rays.  Brain and nervous system tests.  Analyzing your brain waves (electroencephalogram or EEG).  Magnetic resonance imaging (MRI) of your head.  Computed tomography (CT) scan of your head.  Mental status tests in which your health care provider may ask many questions. Some of these questions may seem silly or strange, but they are a very important test to help  diagnose and treat confusion. TREATMENT  An admission to the hospital may not be needed, but a person with confusion should not be left alone. Stay with a family member or friend until the confusion clears. Avoid alcohol, pain relievers, or sedative drugs until you have fully recovered. Do not drive until directed by your health care provider. HOME CARE INSTRUCTIONS  What family and friends can do:  To find out if someone is confused, ask the person to state his or her name, age, and the date. If the person is unsure or answers incorrectly, he or she is confused.  Always introduce yourself, no matter how well the person knows you.  Often remind the person of his or her location.  Place a calendar and clock near the confused person.  Help the person with his or her medicines. You may want to use a pill box, an alarm as a reminder, or give the person each dose as prescribed.  Talk about current events and plans for the day.  Try to keep the environment calm, quiet, and peaceful.  Make sure the person keeps follow-up visits with his or her health care  provider. PREVENTION  Ways to prevent confusion:  Avoid alcohol.  Eat a balanced diet.  Get enough sleep.  Take medicine only as directed by your health care provider.  Do not become isolated. Spend time with other people and make plans for your days.  Keep careful watch on your blood sugar levels if you are diabetic. SEEK IMMEDIATE MEDICAL CARE IF:   You develop severe headaches, repeated vomiting, seizures, blackouts, or slurred speech.  There is increasing confusion, weakness, numbness, restlessness, or personality changes.  You develop a loss of balance, have marked dizziness, feel uncoordinated, or fall.  You have delusions, hallucinations, or develop severe anxiety.  Your family members think you need to be rechecked.   This information is not intended to replace advice given to you by your health care provider. Make  sure you discuss any questions you have with your health care provider.   Document Released: 03/07/2004 Document Revised: 02/18/2014 Document Reviewed: 03/05/2013 Elsevier Interactive Patient Education 2016 Elsevier Inc.     Urinary Tract Infection A urinary tract infection (UTI) can occur any place along the urinary tract. The tract includes the kidneys, ureters, bladder, and urethra. A type of germ called bacteria often causes a UTI. UTIs are often helped with antibiotic medicine.  HOME CARE   If given, take antibiotics as told by your doctor. Finish them even if you start to feel better.  Drink enough fluids to keep your pee (urine) clear or pale yellow.  Avoid tea, drinks with caffeine, and bubbly (carbonated) drinks.  Pee often. Avoid holding your pee in for a long time.  Pee before and after having sex (intercourse).  Wipe from front to back after you poop (bowel movement) if you are a woman. Use each tissue only once. GET HELP RIGHT AWAY IF:   You have back pain.  You have lower belly (abdominal) pain.  You have chills.  You feel sick to your stomach (nauseous).  You throw up (vomit).  Your burning or discomfort with peeing does not go away.  You have a fever.  Your symptoms are not better in 3 days. MAKE SURE YOU:   Understand these instructions.  Will watch your condition.  Will get help right away if you are not doing well or get worse.   This information is not intended to replace advice given to you by your health care provider. Make sure you discuss any questions you have with your health care provider.   Document Released: 07/17/2007 Document Revised: 02/18/2014 Document Reviewed: 08/29/2011 Elsevier Interactive Patient Education [employer].

## 2014-12-06 NOTE — ED Provider Notes (Signed)
CSN: [id number]     Arrival date & time 12/06/14  1324 History   First MD Initiated Contact with Patient 12/06/14 1544     Chief Complaint  Patient presents with  . Altered Mental Status     (Consider location/radiation/quality/duration/timing/severity/associated sxs/prior Treatment) Patient is a 74 y.o. female presenting with altered mental status. The history is provided by the patient.  Altered Mental Status Presenting symptoms: confusion   Associated symptoms: no abdominal pain, no fever, no headaches, no rash and no vomiting   Patient w hx dementia, presents w family member indicating in past weeks pt appears more confused than normal.  Pt is alert, content and denies specific physical c/o.  [place], [place]/staying w family here for the past couple months.   Family describes global confusion, disorientation.  Pt thinks is 2010. Pt denies headaches. No chest pain or sob. No abd pain. No nvd. No dysuria or gu c/o. No wt loss. No fever or chills. Denies recent change in meds. No recent trauma or fall.      Past Medical History  Diagnosis Date  . Hypertension   . Migraine   . Alzheimer disease   . Dementia     dx 2011 in [place]  . Stroke ([place])     x 3   (don't know yrs--in [place])   no deficits  . Diabetes mellitus without complication (HCC)     borderline, on no meds, change in diet  . Chronic kidney disease     has stress incontinence  . GERD (gastroesophageal reflux disease)     yrs ago  . Fibromyalgia   . Cancer [facility])     left breast cancer  . Pneumonia    Past Surgical History  Procedure Laterality Date  . Replacement total knee Left   . Cholecystectomy    . Tumor removal Right     right breast tumor removal  . Breast surgery      lumpectomy left breast--had chemo  1972  . Appendectomy    . Abdominal hysterectomy      complete  . Spinal fusion    . Gastric by pass      "yrs ago"  . Orif humerus fracture Right 09/20/2014    Procedure: OPEN  REDUCTION INTERNAL FIXATION (ORIF) DISTAL HUMERUS FRACTURE;  Surgeon: Renette Butters, MD;  Location: [place];  Service: Orthopedics;  Laterality: Right;  . Ulnar nerve transposition Right 09/20/2014    Procedure: ULNAR NERVE DECOMPRESSION/TRANSPOSITION;  Surgeon: Renette Butters, MD;  Location: [place];  Service: Orthopedics;  Laterality: Right;   History reviewed. No pertinent family history. Social History  Substance Use Topics  . Smoking status: Former Smoker -- 1.00 packs/day for 25 years    Types: Cigarettes    Quit date: 09/16/1983  . Smokeless tobacco: Never Used  . Alcohol Use: 1.2 oz/week    2 Shots of liquor per week     Comment: occasionally [employer]   OB History    No data available     Review of Systems  Constitutional: Negative for fever and chills.  HENT: Negative for sore throat.   Eyes: Negative for visual disturbance.  Respiratory: Negative for shortness of breath.   Cardiovascular: Negative for chest pain.  Gastrointestinal: Negative for vomiting, abdominal pain and diarrhea.  Endocrine: Negative for polyuria.  Genitourinary: Negative for dysuria and flank pain.  Musculoskeletal: Negative for back pain and neck pain.  Skin: Negative for rash.  Neurological: Negative  for headaches.  Hematological: Does not bruise/bleed easily.  Psychiatric/Behavioral: Positive for confusion.      Allergies  Codeine; Dilaudid; Propoxyphene; Shellfish allergy; Tramadol; and Adhesive  Home Medications   Prior to Admission medications   Medication Sig Start Date End Date Taking? Authorizing Provider  aspirin 325 MG tablet Take 1 tablet (325 mg total) by mouth daily. 09/20/14  Yes Lovett Calender, PA-C  b complex vitamins tablet Take 1 tablet by mouth daily.   Yes Historical Provider, MD  diphenhydrAMINE (BENADRYL) 25 mg capsule Take 25 mg by mouth every 6 (six) hours as needed for allergies.   Yes Historical Provider, MD  docusate sodium (COLACE) 100 MG capsule Take 1 capsule  (100 mg total) by mouth 2 (two) times daily. 09/20/14  Yes Brittney Kelly, PA-C  Ginkgo Biloba 40 MG TABS Take 2 tablets by mouth.   Yes Historical Provider, MD  lisinopril-hydrochlorothiazide (PRINZIDE,ZESTORETIC) 20-25 MG per tablet Take 1 tablet by mouth daily. 09/10/14  Yes Historical Provider, MD  venlafaxine (EFFEXOR) 75 MG tablet Take 75 mg by mouth 2 (two) times daily.   Yes Historical Provider, MD  butalbital-acetaminophen-caffeine (FIORICET, ESGIC) 50-325-40 MG per tablet Take 1 tablet by mouth 3 (three) times daily as needed for headache.    Historical Provider, MD  loperamide (IMODIUM) 2 MG capsule Take 2 mg by mouth as needed for diarrhea or loose stools.    Historical Provider, MD  Multiple Vitamin (MULTIVITAMIN WITH MINERALS) TABS tablet Take 1 tablet by mouth daily.    Historical Provider, MD  ondansetron (ZOFRAN) 4 MG tablet Take 1 tablet (4 mg total) by mouth every 8 (eight) hours as needed for nausea or vomiting. 09/20/14   Lovett Calender, PA-C  oxyCODONE-acetaminophen (PERCOCET) 5-325 MG per tablet Take 1-2 tablets by mouth every 4 (four) hours as needed for severe pain. 09/21/14   Brittney Kelly, PA-C   BP 102/60 mmHg  Pulse 89  Temp(Src) 97.3 F (36.3 C) (Oral)  Resp 16  SpO2 100% Physical Exam  Constitutional: She appears well-developed and well-nourished. No distress.  HENT:  Head: Atraumatic.  Mouth/Throat: Oropharynx is clear and moist.  Eyes: Conjunctivae are normal. Pupils are equal, round, and reactive to light. No scleral icterus.  Neck: Neck supple. No tracheal deviation present.  No bruit  Cardiovascular: Normal rate, regular rhythm, normal heart sounds and intact distal pulses.   No murmur heard. Pulmonary/Chest: Effort normal and breath sounds normal. No respiratory distress.  Abdominal: Soft. Normal appearance and bowel sounds are normal. She exhibits no distension. There is no tenderness.  Genitourinary:  No cva tenderness  Musculoskeletal: She exhibits no  edema.  Neurological: She is alert. No cranial nerve deficit.  Oriented x person/place, not day or date. Motor intact bil, stre 5/5. sens grossly intact.   Skin: Skin is warm and dry. No rash noted.  Psychiatric: She has a normal mood and affect.  Nursing note and vitals reviewed.   ED Course  Procedures (including critical care time) Labs Review  Results for orders placed or performed during the hospital encounter of 12/06/14  CBC  Result Value Ref Range   WBC 9.2 4.0 - 10.5 K/uL   RBC 3.38 (L) 3.87 - 5.11 MIL/uL   Hemoglobin 10.0 (L) 12.0 - 15.0 g/dL   HCT 30.1 (L) 36.0 - 46.0 %   MCV 89.1 78.0 - 100.0 fL   MCH 29.6 26.0 - 34.0 pg   MCHC 33.2 30.0 - 36.0 g/dL   RDW 17.1 (H) 11.5 -  15.5 %   Platelets 289 150 - 400 K/uL  Comprehensive metabolic panel  Result Value Ref Range   Sodium 140 135 - 145 mmol/L   Potassium 3.5 3.5 - 5.1 mmol/L   Chloride 108 101 - 111 mmol/L   CO2 21 (L) 22 - 32 mmol/L   Glucose, Bld 92 65 - 99 mg/dL   BUN 17 6 - 20 mg/dL   Creatinine, Ser 1.33 (H) 0.44 - 1.00 mg/dL   Calcium 9.0 8.9 - 10.3 mg/dL   Total Protein 6.3 (L) 6.5 - 8.1 g/dL   Albumin 2.9 (L) 3.5 - 5.0 g/dL   AST 21 15 - 41 U/L   ALT 12 (L) 14 - 54 U/L   Alkaline Phosphatase 96 38 - 126 U/L   Total Bilirubin 0.6 0.3 - 1.2 mg/dL   GFR calc non Af Amer 39 (L) >60 mL/min   GFR calc Af Amer 45 (L) >60 mL/min   Anion gap 11 5 - 15  Urinalysis, Routine w reflex microscopic (not at [facility])  Result Value Ref Range   Color, Urine YELLOW YELLOW   APPearance CLOUDY (A) CLEAR   Specific Gravity, Urine 1.022 1.005 - 1.030   pH 5.0 5.0 - 8.0   Glucose, UA NEGATIVE NEGATIVE mg/dL   Hgb urine dipstick NEGATIVE NEGATIVE   Bilirubin Urine NEGATIVE NEGATIVE   Ketones, ur NEGATIVE NEGATIVE mg/dL   Protein, ur NEGATIVE NEGATIVE mg/dL   Urobilinogen, UA 0.2 0.0 - 1.0 mg/dL   Nitrite NEGATIVE NEGATIVE   Leukocytes, UA SMALL (A) NEGATIVE  Urine microscopic-add on  Result Value Ref Range   Squamous  Epithelial / LPF FEW (A) RARE   WBC, UA 7-10 <3 WBC/hpf   RBC / HPF 3-6 <3 RBC/hpf   Bacteria, UA RARE RARE   Casts HYALINE CASTS (A) NEGATIVE   Urine-Other FEW YEAST    Ct Head Wo Contrast  12/06/2014  CLINICAL DATA:  Left-sided migraine headache with some reported memory loss. Confusion. EXAM: CT HEAD WITHOUT CONTRAST TECHNIQUE: Contiguous axial images were obtained from the base of the skull through the vertex without intravenous contrast. COMPARISON:  09/07/2014 FINDINGS: The ventricles are normal in configuration. There is ventricular and sulcal enlargement reflecting mild age related atrophy, stable. There is no hydrocephalus. There are no parenchymal masses or mass effect. There is no cortical infarct. Patchy areas of white matter hypoattenuation noted consistent with chronic microvascular ischemic change, also stable. There are no extra-axial masses or abnormal fluid collections. There is no intracranial hemorrhage. The visualized sinuses and mastoid air cells are clear. No skull lesion. IMPRESSION: 1. No acute intracranial abnormalities. 2. Mild atrophy and chronic microvascular ischemic change, stable when compared to the prior head CT. Electronically Signed   By: Lajean Manes M.D.   On: 12/06/2014 17:18      I have personally reviewed and evaluated these images and lab results as part of my medical decision-making.    MDM   Labs.  Reviewed nursing notes and prior charts for additional history.   Ct neg acute.  Recheck pt content, alert, tolerating po.  Pt w cath ua, clody urine, small LE, 7-10 wbc - will rx for possible uti.  Pt also w hx dementia. No other acute/focal/unilateral neurologic signs or symptoms. Neuro exam without focal/unilateral deficit.  Pt continues to deny any c/o, and appears stable for d/c.       Lajean Saver, MD 12/06/14 2003

## 2014-12-06 NOTE — ED Notes (Signed)
Patient given a cup of ice water to drink per order

## 2014-12-06 NOTE — ED Notes (Signed)
Patient aware of need of urine specimen; patient instructed to ring call bell when she can give an urine sample

## 2014-12-06 NOTE — ED Notes (Signed)
Brought patient back to room with family in tow; patient getting undressed and into a gown at this time

## 2014-12-13 ENCOUNTER — Ambulatory Visit (INDEPENDENT_AMBULATORY_CARE_PROVIDER_SITE_OTHER): Payer: Self-pay | Admitting: Neurology

## 2014-12-13 ENCOUNTER — Encounter: Payer: Self-pay | Admitting: Neurology

## 2014-12-13 VITALS — BP 124/76 | HR 106 | Ht 71.0 in | Wt 230.2 lb

## 2014-12-13 DIAGNOSIS — F32A Depression, unspecified: Secondary | ICD-10-CM

## 2014-12-13 DIAGNOSIS — F0280 Dementia in other diseases classified elsewhere without behavioral disturbance: Secondary | ICD-10-CM | POA: Insufficient documentation

## 2014-12-13 DIAGNOSIS — G309 Alzheimer's disease, unspecified: Secondary | ICD-10-CM

## 2014-12-13 DIAGNOSIS — E538 Deficiency of other specified B group vitamins: Secondary | ICD-10-CM

## 2014-12-13 DIAGNOSIS — F02818 Dementia in other diseases classified elsewhere, unspecified severity, with other behavioral disturbance: Secondary | ICD-10-CM

## 2014-12-13 DIAGNOSIS — F0391 Unspecified dementia with behavioral disturbance: Secondary | ICD-10-CM

## 2014-12-13 DIAGNOSIS — F03918 Unspecified dementia, unspecified severity, with other behavioral disturbance: Secondary | ICD-10-CM

## 2014-12-13 DIAGNOSIS — F329 Major depressive disorder, single episode, unspecified: Secondary | ICD-10-CM

## 2014-12-13 DIAGNOSIS — G301 Alzheimer's disease with late onset: Secondary | ICD-10-CM

## 2014-12-13 DIAGNOSIS — F0281 Dementia in other diseases classified elsewhere with behavioral disturbance: Secondary | ICD-10-CM

## 2014-12-13 MED ORDER — RIVASTIGMINE TARTRATE 3 MG PO CAPS: 3.0000 mg | ORAL_CAPSULE | Freq: Two times a day (BID) | ORAL | Status: AC

## 2014-12-13 NOTE — Progress Notes (Signed)
[facility]    Provider:  Dr Jaynee Eagles Referring Provider: No ref. provider found Primary Care Physician:  Horatio Pel, MD  CC:  dementia  HPI:  Bailey Taylor is a 74 y.o. female here as a referral from Dr. No ref. provider found for dementia. PMHx HTN, Migraine, Alzheimer's Disease, stroke, DM, CKD, fibromyalgia, breast cancer, pneumonia. She came here from [place] to visit family and she had an in-house accident, fell in the hallway and broke her arm 2.5 months ago and she has been here since then. She lives with her husband but is home most of the days alone but lives with husband. She has not been home since the surgery. She is here with son in law who provides a lot of information. She has had memory loss for several years which have been slowly progressive. The family has noticed that the memory loss started as short-term memory loss but unclear as she was living away from family. She has suffered with depression and treated for depression. She will forget conversation, forget people's names, firget what time or day it is, told me her son in law (for 23 years) was her brother in Sports coach. No hallucinations or delusions or extreme changes in personality. There are some behavioral issues, patient can become irritated sometimes but no severe behavioral issues. No outbursts. She wanders at night. Melatonin helps her rest now. She still has some depression during the day but today she seems very pleasant. She has been diagnosed with Alzheimer's dementia in the past. She is going back to [place] when she finishes her therapy. Sister with Alzheimers. She is not driving.   Reviewed notes, labs and imaging from outside physicians, which showed:  Ct showed No acute intracranial abnormalities including mass lesion or mass effect, hydrocephalus, extra-axial fluid collection, midline shift, hemorrhage, or acute infarction, large ischemic events (personally reviewed images). Does show Mild  atrophy and chronic microvascular ischemic change, stable when compared to the prior head CT 08/2014.  Patient was seen in the ED 12/06/2014. Per notes, presents w family member indicating in past weeks pt appears more confused than normal. Pt is alert, content and denies specific physical c/o. [place] out of state, visiting/staying w family here for the past couple months. Family describes global confusion, disorientation. Pt thinks is 2010. Pt denies headaches. No chest pain or sob. No abd pain. No nvd. No dysuria or gu c/o. No wt loss. No fever or chills. Denies recent change in meds. No recent trauma or fall. She was treated for UTI.    Review of Systems: Patient complains of symptoms per HPI as well as the following symptoms: feeling cold, constipation, birth marks, urination problems, incontinence, confusion, depression, too much sleep, change in appetite, sleepiness. . Pertinent negatives per HPI. All others negative.   Social History   Social History  . Marital Status: Married    Spouse Name: N/A  . Number of Children: N/A  . Years of Education: N/A   Occupational History  . Not on file.   Social History Main Topics  . Smoking status: Former Smoker -- 1.00 packs/day for 25 years    Types: Cigarettes    Quit date: 09/16/1983  . Smokeless tobacco: Never Used  . Alcohol Use: 1.2 oz/week    2 Shots of liquor per week     Comment: occasionally [employer]  . Drug Use: No  . Sexual Activity: Not on file   Other Topics Concern  . Not on file  Social History Narrative    Family History  Problem Relation Age of Onset  . Dementia Sister     Past Medical History  Diagnosis Date  . Hypertension   . Migraine   . Alzheimer disease   . Dementia     dx [place]  . Stroke ([place])     x 3   (don't know yrs--in [place])   no deficits  . Diabetes mellitus without complication (HCC)     borderline, on no meds, change in diet  . Chronic kidney disease     has stress  incontinence  . GERD (gastroesophageal reflux disease)     yrs ago  . Fibromyalgia   . Cancer [facility])     left breast cancer  . Pneumonia     Past Surgical History  Procedure Laterality Date  . Replacement total knee Left   . Cholecystectomy    . Tumor removal Right     right breast tumor removal  . Breast surgery      lumpectomy left breast--had chemo  1972  . Appendectomy    . Abdominal hysterectomy      complete  . Spinal fusion    . Gastric by pass      "yrs ago"  . Orif humerus fracture Right 09/20/2014    Procedure: OPEN REDUCTION INTERNAL FIXATION (ORIF) DISTAL HUMERUS FRACTURE;  Surgeon: Renette Butters, MD;  Location: [facility];  Service: Orthopedics;  Laterality: Right;  . Ulnar nerve transposition Right 09/20/2014    Procedure: ULNAR NERVE DECOMPRESSION/TRANSPOSITION;  Surgeon: Renette Butters, MD;  Location: [facility];  Service: Orthopedics;  Laterality: Right;    Current Outpatient Prescriptions  Medication Sig Dispense Refill  . acetaminophen (TYLENOL) 500 MG tablet Take 325 mg by mouth 3 (three) times daily as needed.     Marland Kitchen aspirin 325 MG tablet Take 1 tablet (325 mg total) by mouth daily. 30 tablet 0  . b complex vitamins tablet Take 1 tablet by mouth daily.    . butalbital-acetaminophen-caffeine (FIORICET, ESGIC) 50-325-40 MG per tablet Take 1 tablet by mouth 3 (three) times daily as needed for headache.    . cephALEXin (KEFLEX) 500 MG capsule Take 1 capsule (500 mg total) by mouth 3 (three) times daily. 15 capsule 0  . diphenhydrAMINE (BENADRYL) 25 mg capsule Take 25 mg by mouth every 6 (six) hours as needed for allergies.    Marland Kitchen docusate sodium (COLACE) 100 MG capsule Take 1 capsule (100 mg total) by mouth 2 (two) times daily. 10 capsule 0  . Ginkgo Biloba 40 MG TABS Take 2 tablets by mouth.    Marland Kitchen lisinopril-hydrochlorothiazide (PRINZIDE,ZESTORETIC) 20-25 MG per tablet Take 1 tablet by mouth daily.  2  . loperamide (IMODIUM) 2 MG capsule Take 2 mg by mouth as needed for  diarrhea or loose stools.    . Multiple Vitamin (MULTIVITAMIN WITH MINERALS) TABS tablet Take 1 tablet by mouth daily.    . ondansetron (ZOFRAN) 4 MG tablet Take 1 tablet (4 mg total) by mouth every 8 (eight) hours as needed for nausea or vomiting. 20 tablet 0  . oxyCODONE-acetaminophen (PERCOCET) 5-325 MG per tablet Take 1-2 tablets by mouth every 4 (four) hours as needed for severe pain. 90 tablet 0  . venlafaxine (EFFEXOR) 75 MG tablet Take 75 mg by mouth 2 (two) times daily.    . ferrous sulfate 325 (65 FE) MG tablet Take 1 tablet by mouth daily.  1  . rivastigmine (EXELON) 3 MG  capsule Take 1 capsule (3 mg total) by mouth 2 (two) times daily. 60 capsule 3   No current facility-administered medications for this visit.    Allergies as of 12/13/2014 - Review Complete 12/13/2014  Allergen Reaction Noted  . Codeine Itching and Rash 09/07/2014  . Donepezil Other (See Comments) 12/13/2014  . Dilaudid [hydromorphone hcl] Hives 09/16/2014  . Propoxyphene Itching 09/07/2014  . Shellfish allergy Hives 09/16/2014  . Tramadol Itching 09/16/2014  . Adhesive [tape] Rash 09/16/2014    Vitals: BP 124/76 mmHg  Pulse 106  Ht 5\' 11"  (1.803 m)  Wt 230 lb 3.2 oz (104.418 kg)  BMI 32.12 kg/m2 Last Weight:  Wt Readings from Last 1 Encounters:  12/13/14 230 lb 3.2 oz (104.418 kg)   Last Height:   Ht Readings from Last 1 Encounters:  12/13/14 5\' 11"  (1.803 m)   Physical exam: Exam: Gen: NAD, conversant, well nourised, obese, well groomed                     CV: RRR, no MRG. No Carotid Bruits. No peripheral edema, warm, nontender Eyes: Conjunctivae clear without exudates or hemorrhage  Neuro: Detailed Neurologic Exam  Speech:    Speech is normal; fluent and spontaneous with normal comprehension.  Cognition: Montreal Cognitive Assessment  12/13/2014  Visuospatial/ Executive (0/5) 2  Naming (0/3) 3  Attention: Read list of digits (0/2) 2  Attention: Read list of letters (0/1) 1    Attention: Serial 7 subtraction starting at 100 (0/3) 1  Language: Repeat phrase (0/2) 2  Language : Fluency (0/1) 0  Abstraction (0/2) 2  Delayed Recall (0/5) 0  Orientation (0/6) 3  Total 16  Adjusted Score (based on education) 16    Cranial Nerves:    The pupils are equal, round, and reactive to light. The fundi are flat Visual fields are full to finger confrontation. Extraocular movements are intact. Trigeminal sensation is intact and the muscles of mastication are normal. The face is symmetric. The palate elevates in the midline. Hearing intact. Voice is normal. Shoulder shrug is normal. The tongue has normal motion without fasciculations.   Coordination:    Normal finger to nose and heel to shin. Normal rapid alternating movements.   Gait:    Heel and toe walk intact  Motor Observation:    No asymmetry, no atrophy, and no involuntary movements noted. Tone:    Normal muscle tone.    Posture:    Posture is normal. normal erect    Strength:    Strength is V/V in the upper and lower limbs.      Sensation: intact to LT     Reflex Exam:  DTR's: absent AJs. Knees with replacements. Uppers normal.     Toes:    The toes are equivocal bilaterally.   Clonus:    Clonus is absent.       Assessment/Plan:  73 year old female with dementia with mild behavior disturbances, likely Alzheimers. She has a neurologist that she follows with in [place] and she is here because she is staying with family.Recently seen in the ED for worsening confusion treated for a UTI and back to baseline. Exam is non focal.   She is seeing a neurologist is [place] and will be going back. Follow up with neurology at home. She had diarrhea with the Donepezil. Will try Exelon.  If has similar symptoms please stop and call, will try Exelon patch. Can start Rivastigmine and then increase as tolerated Consider  namenda after Titration of the Exelon B12 and TSh labs Patient's MoCA 16/30. She lives  with her husband. Discussed with son-in-law she should no tbe left at home alone in the house, they should consider visiting angels or a caretaker when husband is away from the home. Husband does not have memory loss.    Sarina Ill, MD  [facility] [address] [place], [place] [address]  Phone [phone] Fax [phone]

## 2014-12-13 NOTE — Patient Instructions (Addendum)
Remember to drink plenty of fluid, eat healthy meals and do not skip any meals. Try to eat protein with a every meal and eat a healthy snack such as fruit or nuts in between meals. Try to keep a regular sleep-wake schedule and try to exercise daily, particularly in the form of walking, 20-30 minutes a day, if you can.   As far as your medications are concerned, I would like to suggest: Rivastigmine (Exelon) 3mg  twice daily. Stop for side effects. If Diarrhea occurs can switch to patch. Also suggest Namenda at a later time with follow up in [place]   As far as diagnostic testing: Labs  Our phone number is [phone]. We also have an after hours call service for urgent matters and there is a physician on-call for urgent questions. For any emergencies you know to call 911 or go to the nearest emergency room

## 2014-12-14 ENCOUNTER — Telehealth: Payer: Self-pay | Admitting: *Deleted

## 2014-12-14 NOTE — Telephone Encounter (Signed)
LVM for pt to call back about results. Okay to inform labs normal.

## 2014-12-14 NOTE — Telephone Encounter (Signed)
-----   Message from Melvenia Beam, MD sent at 12/14/2014 10:39 AM EDT ----- Please let patient know labs are normal. thanks

## 2014-12-15 LAB — B12 AND FOLATE PANEL
Folate: 11.1 ng/mL (ref >3.0)
Vitamin B-12: 1048 pg/mL — ABNORMAL HIGH (ref 211–946)

## 2014-12-15 LAB — METHYLMALONIC ACID, SERUM: Methylmalonic Acid: 186 nmol/L (ref 0–378)

## 2014-12-15 LAB — THYROID PANEL WITH TSH
Free Thyroxine Index: 1.3 (ref 1.2–4.9)
T3 Uptake Ratio: 28 % (ref 24–39)
T4, Total: 4.5 ug/dL (ref 4.5–12.0)
TSH: 0.846 u[IU]/mL (ref 0.450–4.500)

## 2014-12-15 NOTE — Telephone Encounter (Signed)
I spoke to pt and relayed the lab results she had done in the office were normal.  She verbalized understanding.

## 2014-12-16 ENCOUNTER — Telehealth: Payer: Self-pay | Admitting: *Deleted

## 2014-12-16 NOTE — Telephone Encounter (Signed)
Pt aware of normal labs

## 2014-12-16 NOTE — Telephone Encounter (Signed)
-----   Message from Melvenia Beam, MD sent at 12/15/2014  4:54 PM EDT ----- Let patient know labs normal thanks

## 2016-01-08 IMAGING — CT CT 3D INDEPENDENT WKST
3 of 6 series · 16 of 33 positions shown, 20 images · non-contrast
Comparison: none

CLINICAL DATA: Abnormal findings on recent x-ray imaging which
demonstrated a fracture of fracture of the distal RIGHT humerus.

EXAM:
3-DIMENSIONAL CT IMAGE RENDERING ON INDEPENDENT WORKSTATION
TECHNIQUE: 3-dimensional CT images were rendered by post-processing of the
original CT data at independent workstation.

[Series 10: thin soft · axial · 0.63mm/px · z∈[-306,-16]mm · 9 of 581 slices shown, 12 images]
[im 59/581  soft-tissue]
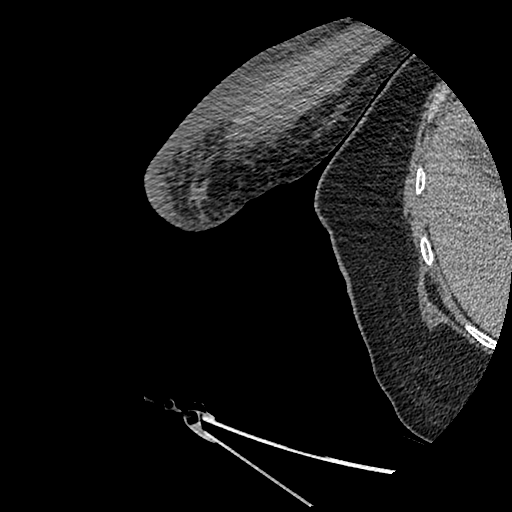
[im 59/581  bone]
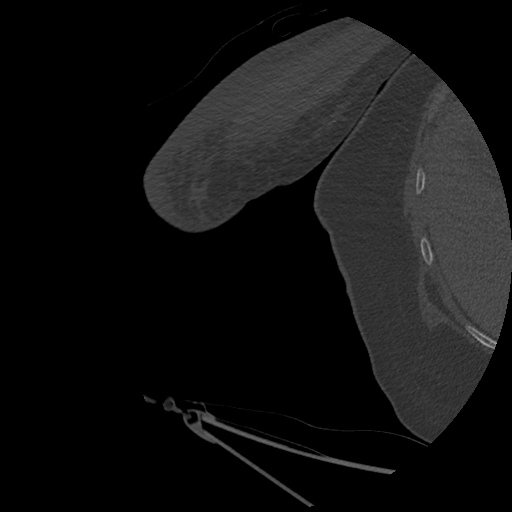
[im 117/581  bone]
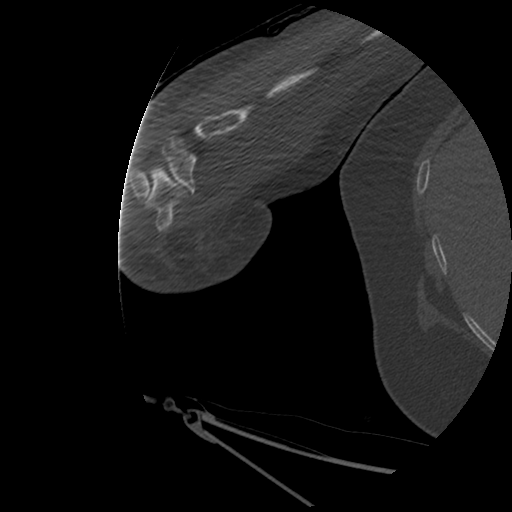
[im 175/581  bone]
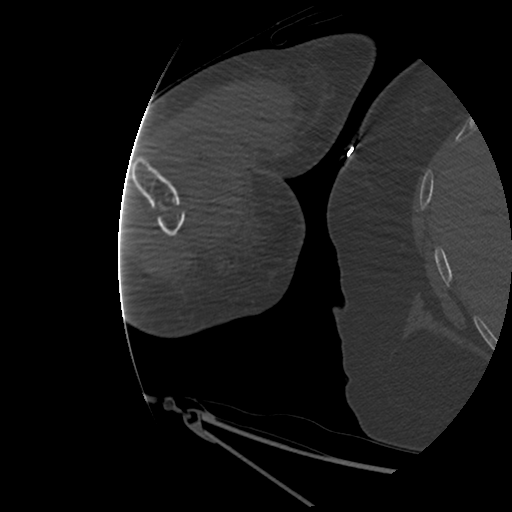
[im 233/581  bone]
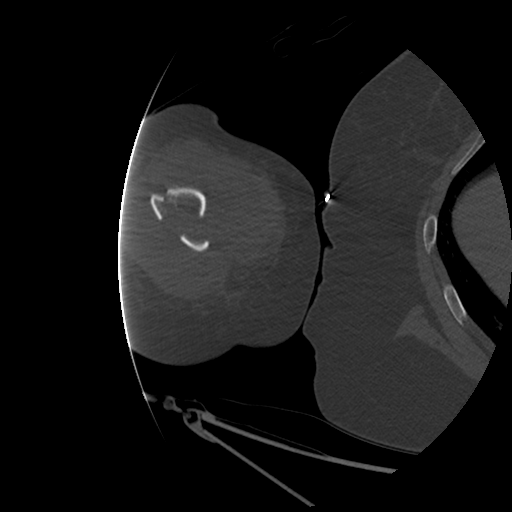
[im 291/581  soft-tissue]
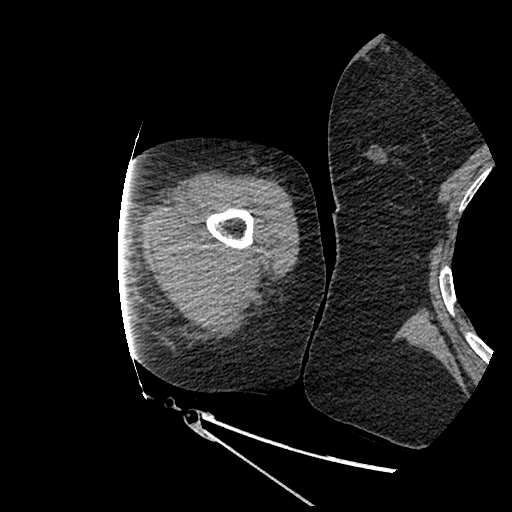
[im 291/581  bone]
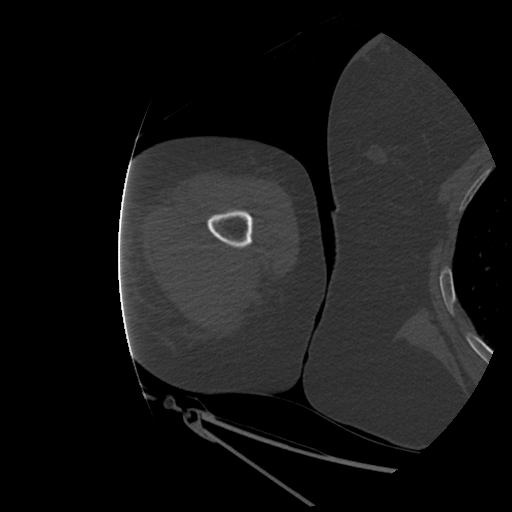
[im 349/581  bone]
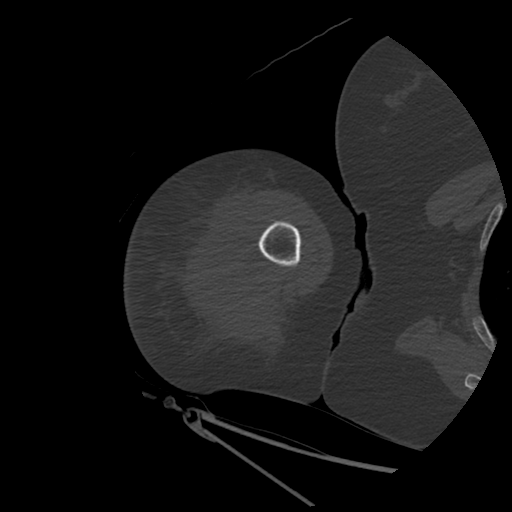
[im 407/581  bone]
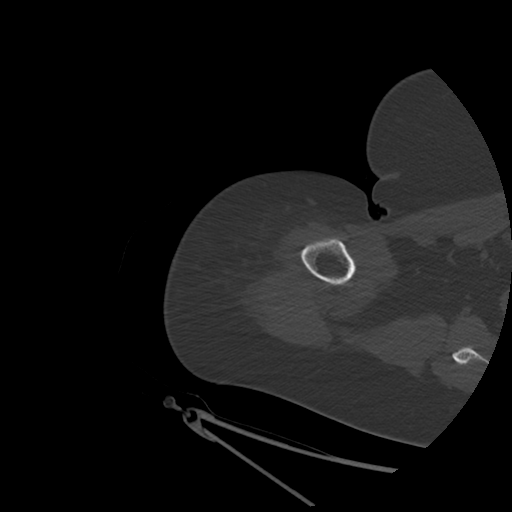
[im 465/581  bone]
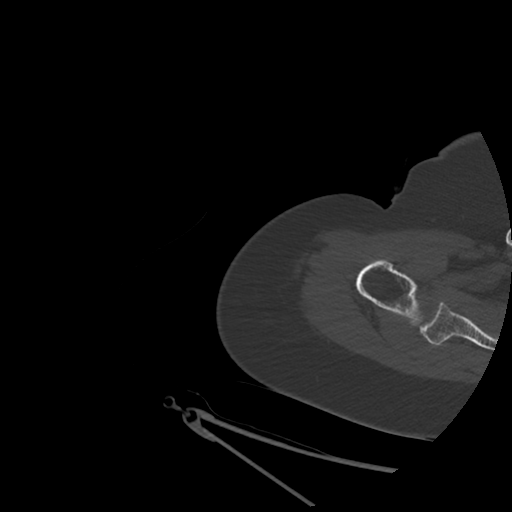
[im 523/581  soft-tissue]
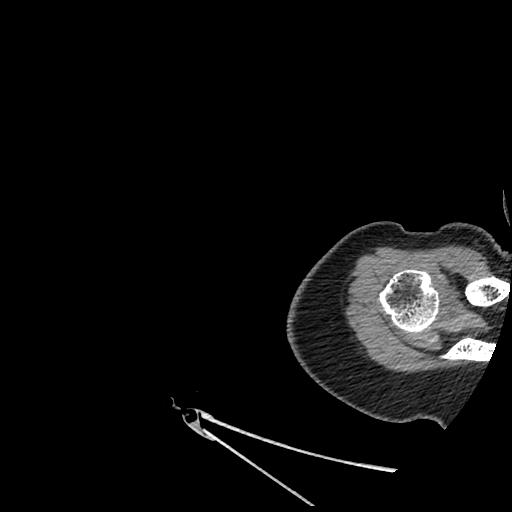
[im 523/581  bone]
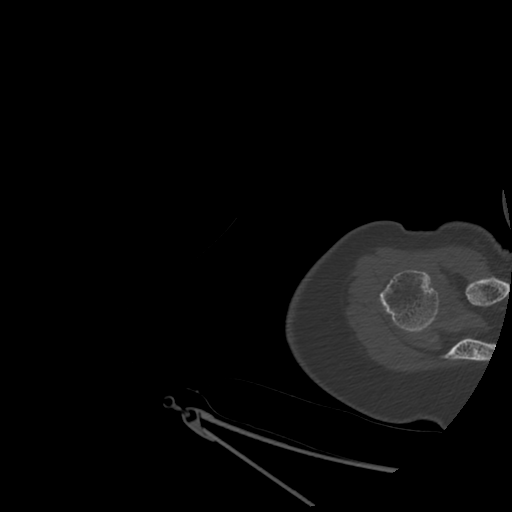

[Series 901: cor bone · coronal · 0.73mm/px · 2 of 136 slices shown]
[im 46/136  bone]
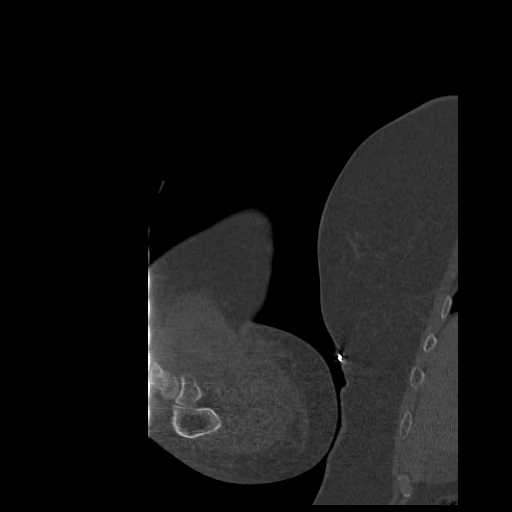
[im 91/136  bone]
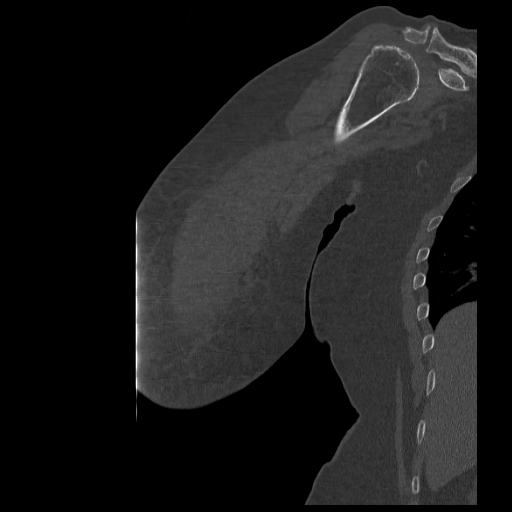

[Series 1000: sag soft · sagittal · 0.73mm/px · 5 of 66 slices shown, 6 images]
[im 24/66  bone]
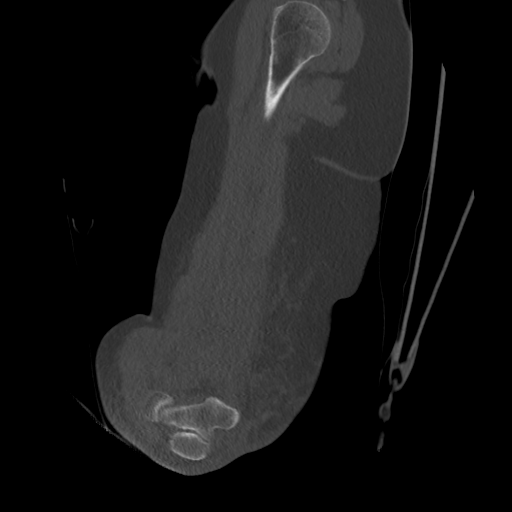
[im 29/66  bone]
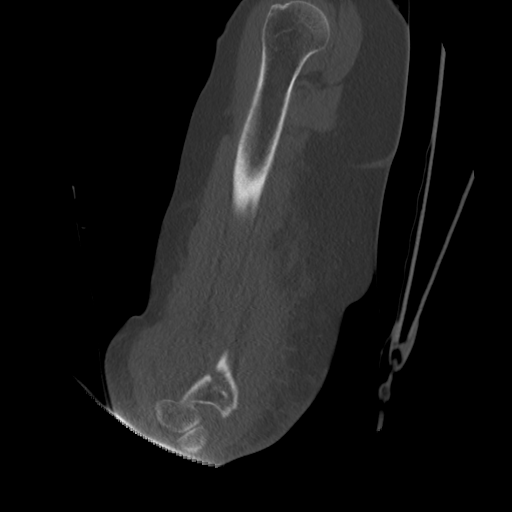
[im 33/66  soft-tissue]
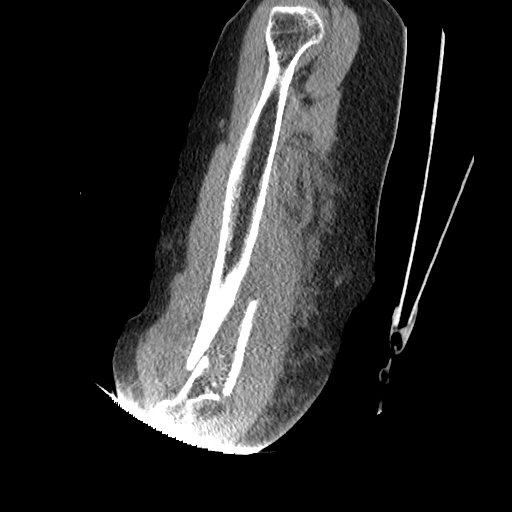
[im 33/66  bone]
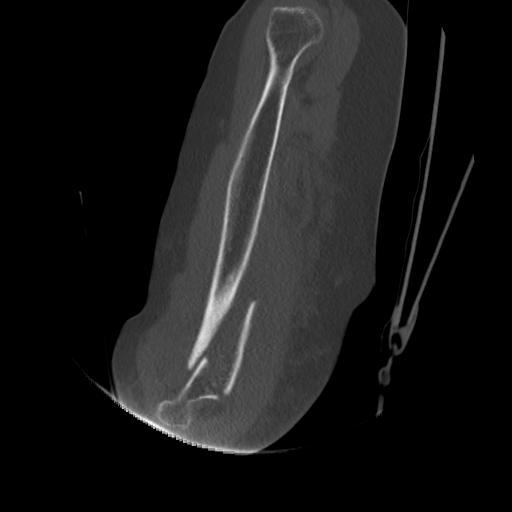
[im 37/66  bone]
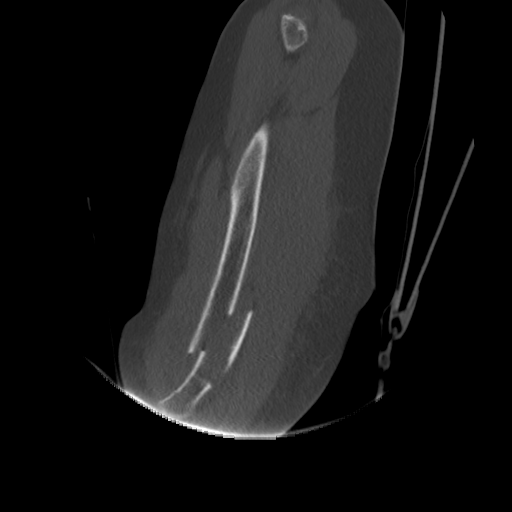
[im 42/66  bone]
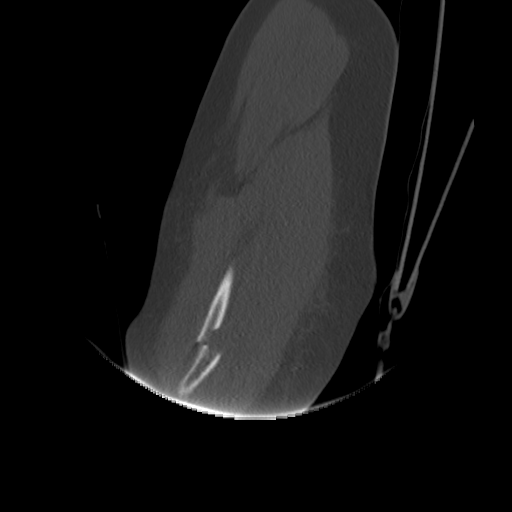

[16 of 33 positions shown; findings below may reference images not displayed]

FINDINGS: Three dimensional reconstructions were performed by the radiologist
at an independent workstation.
IMPRESSION: As above.

## 2020-07-12 DEATH — deceased
